# Patient Record
Sex: Male | Born: 1935
Health system: Southern US, Community
[De-identification: ages and names within clinical notes are randomized; demographics above are authoritative.]

## PROBLEM LIST (undated history)

## (undated) DIAGNOSIS — R42 Dizziness and giddiness: Secondary | ICD-10-CM

## (undated) DIAGNOSIS — K219 Gastro-esophageal reflux disease without esophagitis: Secondary | ICD-10-CM

## (undated) DIAGNOSIS — I493 Ventricular premature depolarization: Secondary | ICD-10-CM

## (undated) DIAGNOSIS — I2510 Atherosclerotic heart disease of native coronary artery without angina pectoris: Secondary | ICD-10-CM

## (undated) DIAGNOSIS — I10 Essential (primary) hypertension: Secondary | ICD-10-CM

## (undated) DIAGNOSIS — E7800 Pure hypercholesterolemia, unspecified: Secondary | ICD-10-CM

## (undated) HISTORY — DX: Ventricular premature depolarization: I49.3

## (undated) HISTORY — PX: CATARACT EXTRACTION: SUR2

---

## 1999-07-16 ENCOUNTER — Ambulatory Visit (HOSPITAL_COMMUNITY): Admission: RE | Admit: 1999-07-16 | Discharge: 1999-07-16 | Payer: Self-pay | Admitting: Cardiology

## 1999-07-16 ENCOUNTER — Encounter: Payer: Self-pay | Admitting: Cardiology

## 1999-07-27 ENCOUNTER — Ambulatory Visit (HOSPITAL_COMMUNITY): Admission: RE | Admit: 1999-07-27 | Discharge: 1999-07-28 | Payer: Self-pay | Admitting: Cardiology

## 2000-08-11 ENCOUNTER — Ambulatory Visit (HOSPITAL_COMMUNITY): Admission: RE | Admit: 2000-08-11 | Discharge: 2000-08-11 | Payer: Self-pay | Admitting: Cardiology

## 2000-08-11 ENCOUNTER — Encounter: Payer: Self-pay | Admitting: Cardiology

## 2000-08-17 ENCOUNTER — Ambulatory Visit (HOSPITAL_COMMUNITY): Admission: RE | Admit: 2000-08-17 | Discharge: 2000-08-17 | Payer: Self-pay | Admitting: Cardiology

## 2001-03-23 ENCOUNTER — Encounter: Payer: Self-pay | Admitting: Cardiology

## 2001-03-23 ENCOUNTER — Ambulatory Visit (HOSPITAL_COMMUNITY): Admission: RE | Admit: 2001-03-23 | Discharge: 2001-03-23 | Payer: Self-pay | Admitting: Cardiology

## 2003-04-06 ENCOUNTER — Emergency Department (HOSPITAL_COMMUNITY): Admission: EM | Admit: 2003-04-06 | Discharge: 2003-04-06 | Payer: Self-pay | Admitting: Emergency Medicine

## 2003-12-27 ENCOUNTER — Emergency Department (HOSPITAL_COMMUNITY): Admission: EM | Admit: 2003-12-27 | Discharge: 2003-12-27 | Payer: Self-pay

## 2004-08-09 ENCOUNTER — Emergency Department (HOSPITAL_COMMUNITY): Admission: EM | Admit: 2004-08-09 | Discharge: 2004-08-09 | Payer: Self-pay | Admitting: Emergency Medicine

## 2006-08-29 ENCOUNTER — Emergency Department (HOSPITAL_COMMUNITY): Admission: EM | Admit: 2006-08-29 | Discharge: 2006-08-29 | Payer: Self-pay | Admitting: Emergency Medicine

## 2007-12-26 ENCOUNTER — Emergency Department (HOSPITAL_COMMUNITY): Admission: EM | Admit: 2007-12-26 | Discharge: 2007-12-26 | Payer: Self-pay | Admitting: Family Medicine

## 2007-12-29 ENCOUNTER — Emergency Department (HOSPITAL_COMMUNITY): Admission: EM | Admit: 2007-12-29 | Discharge: 2007-12-29 | Payer: Self-pay | Admitting: Emergency Medicine

## 2008-01-07 ENCOUNTER — Encounter: Admission: RE | Admit: 2008-01-07 | Discharge: 2008-01-07 | Payer: Self-pay | Admitting: Cardiology

## 2008-01-24 ENCOUNTER — Encounter: Admission: RE | Admit: 2008-01-24 | Discharge: 2008-01-24 | Payer: Self-pay | Admitting: Cardiology

## 2008-01-24 IMAGING — RF DG ESOPHAGUS
14 of 18 series · 19 of 24 positions shown · non-contrast
Comparison: [REDACTED] chest x-ray [DATE]

CLINICAL DATA: Painful swallowing /dysphasia

BARIUM SWALLOW / ESOPHAGRAM
TECHNIQUE: Double and single contrast barium swallow with ingested
13 mm barium tablet with water

[Series 1: run · 1 of 2 slices shown (1 of 14)]
[im 1/2]
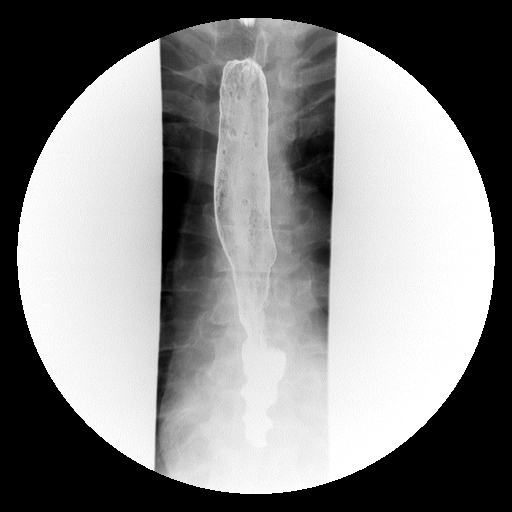

[Series 2: run · 1 of 3 slices shown (2 of 14)]
[im 1/3]
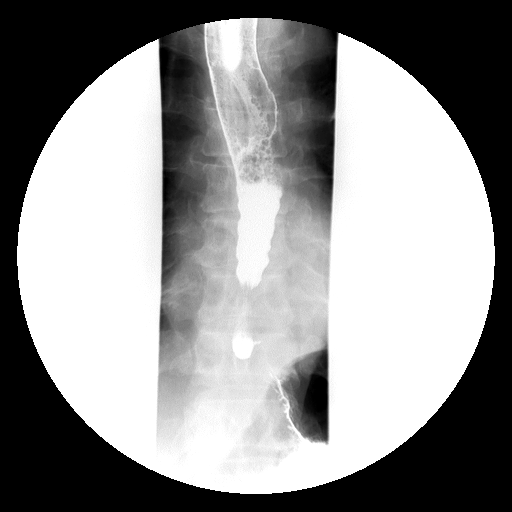

[Series 4: run · 1 of 19 slices shown (3 of 14)]
[im 1/19]
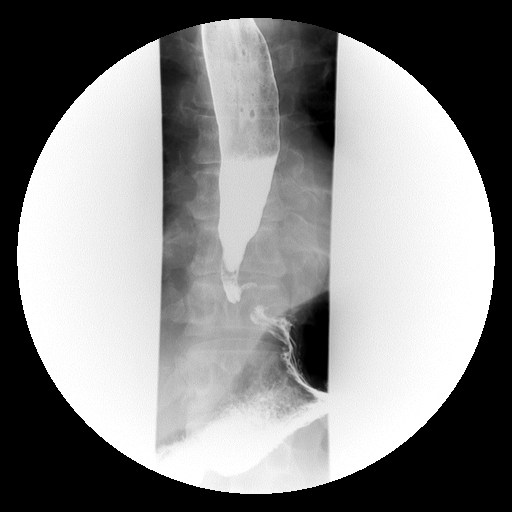

[Series 5: run · 1 of 1 slices shown (4 of 14)]
[im 1/1]
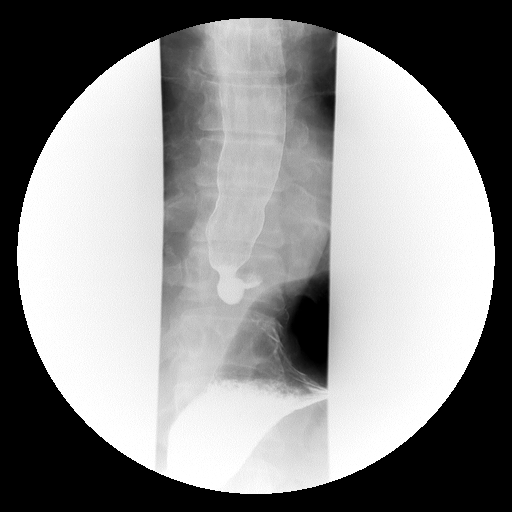

[Series 6: run · 1 of 1 slices shown (5 of 14)]
[im 1/1]
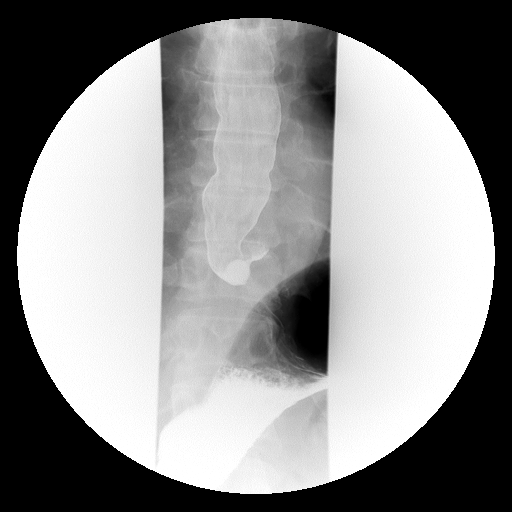

[Series 7: run · 1 of 1 slices shown (6 of 14)]
[im 1/1]
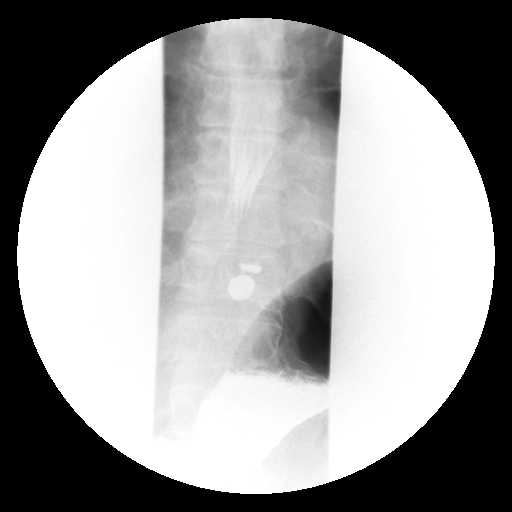

[Series 9: run · 1 of 16 slices shown (7 of 14)]
[im 1/16]
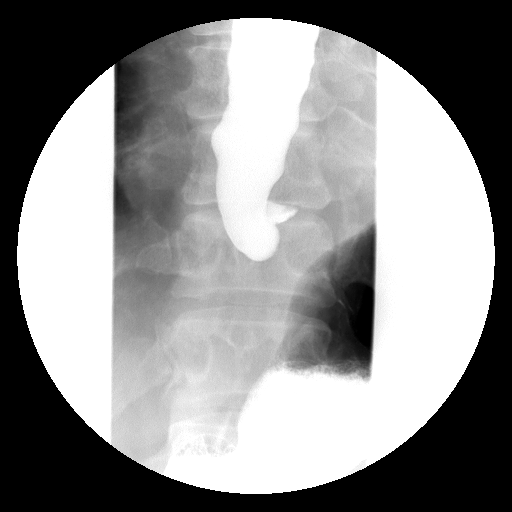

[Series 10: run · 1 of 9 slices shown (8 of 14)]
[im 1/9]
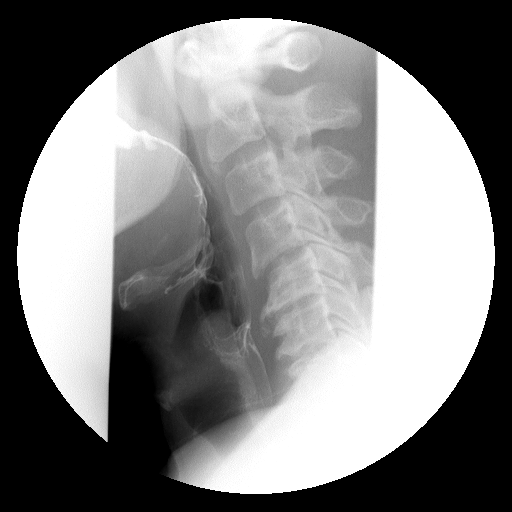

[Series 11: run · 1 of 8 slices shown (9 of 14)]
[im 1/8]
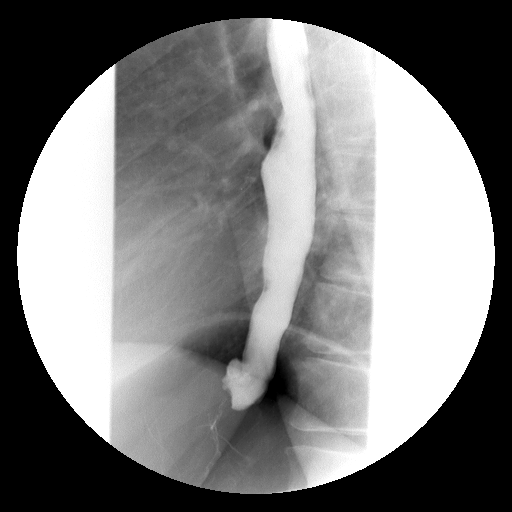

[Series 13: run · 1 of 2 slices shown (10 of 14)]
[im 1/2]
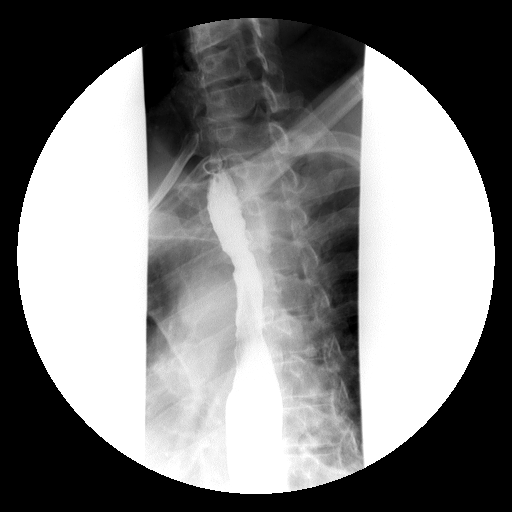

[Series 14: run · 1 of 3 slices shown (11 of 14)]
[im 1/3]
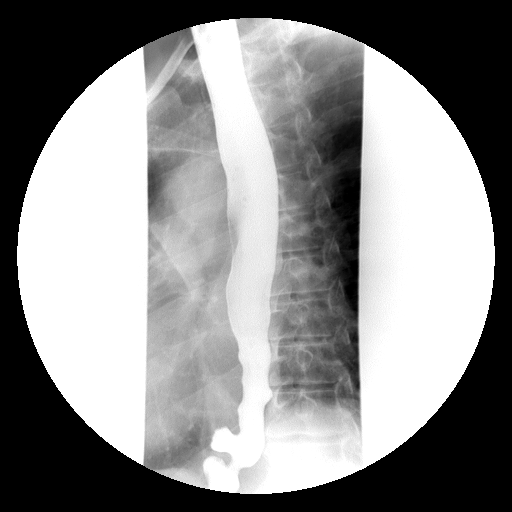

[Series 15: run · 6 of 47 slices shown (12 of 14)]
[im 1/47]
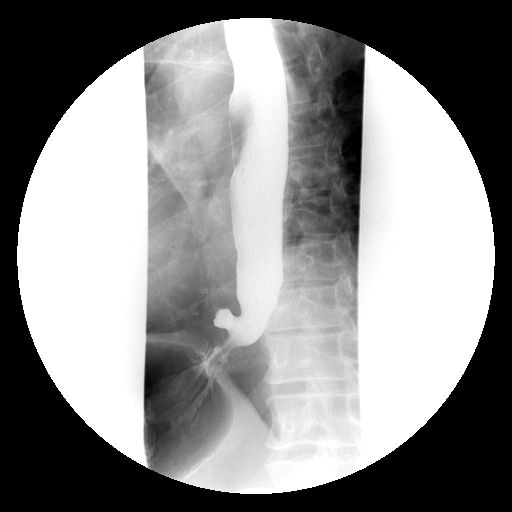
[im 8/47]
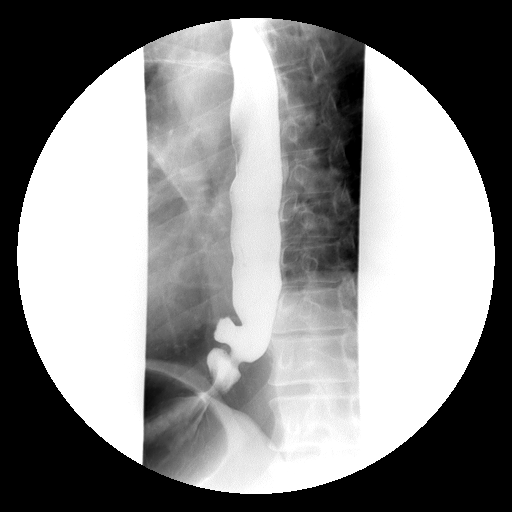
[im 24/47]
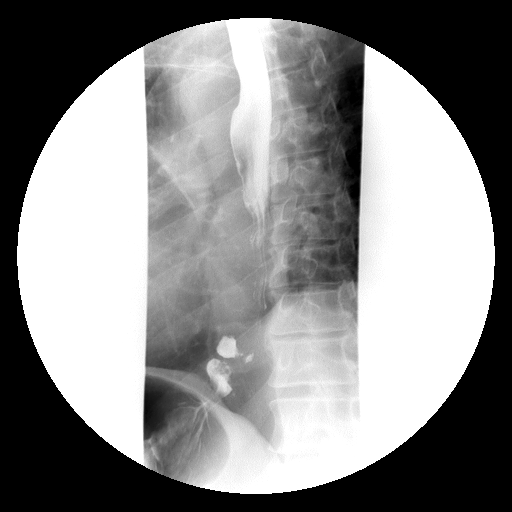
[im 31/47]
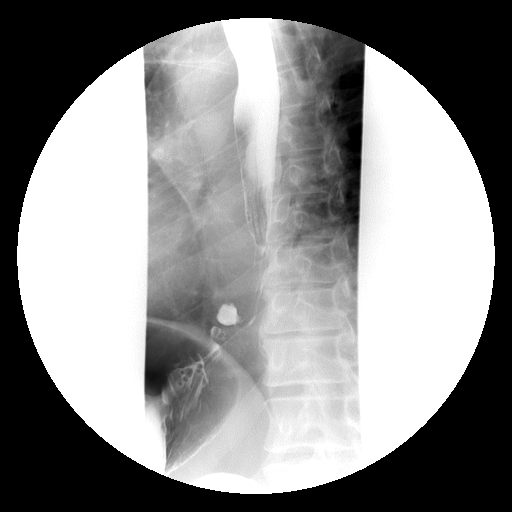
[im 39/47]
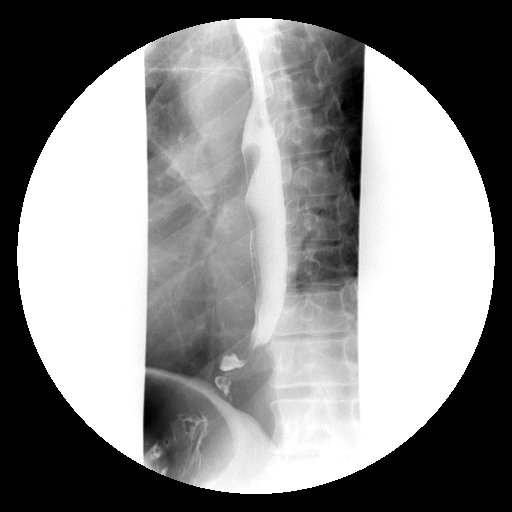
[im 47/47]
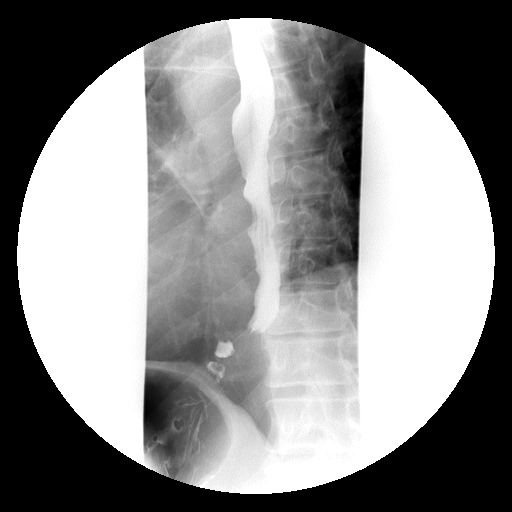

[Series 17: run · 1 of 1 slices shown (13 of 14)]
[im 1/1]
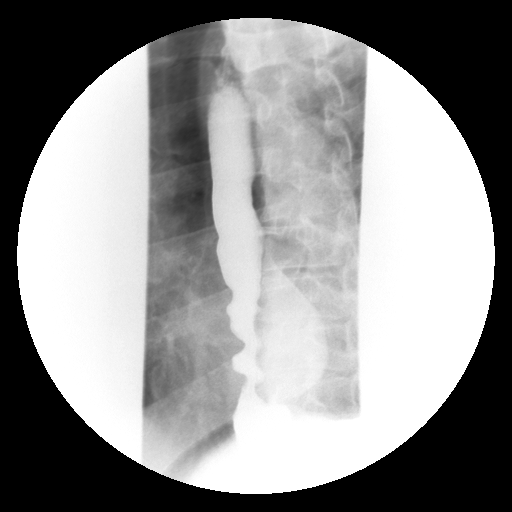

[Series 18: run · 1 of 1 slices shown (14 of 14)]
[im 1/1]
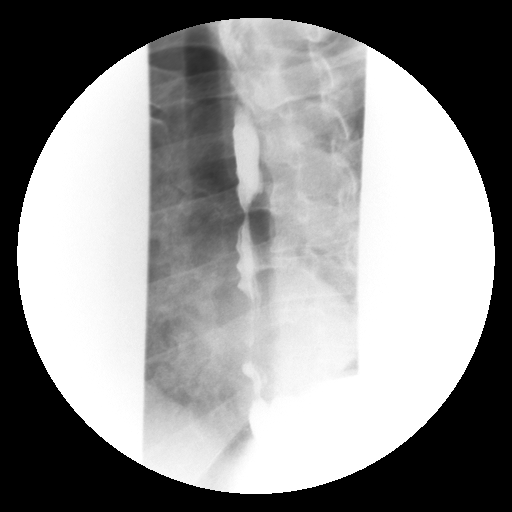

[19 of 24 positions shown; findings below may reference images not displayed]

FINDINGS: Primary cervical esophageal peristalsis is maintained
with slight tertiary contractions in the distal esophagus.  Very
small sliding type hiatus hernia is seen with approximate 1 cm
pulsion type diverticulum at the distal esophagus.  Partially
obstructing lower esophageal/Schatzki's ring noted to ingested 13
mm barium tablet.  No significant obstruction to liquid barium is
seen as a result.  Moderate induced gastroesophageal reflux was
demonstrated (Valsalva/water siphon testing).  No other intrinsic
or extrinsic esophageal lesion is seen.
IMPRESSION: 1.  Very small sliding type hiatus hernia with partially
obstructing lower esophageal/Schatzki's ring (13 mm barium tablet).
2.  Pulsion diverticulum distal esophagus.
3.  Slight tertiary contractions distal thoracic esophagus.
4.  Moderate induced gastroesophageal reflux.

## 2008-01-30 ENCOUNTER — Ambulatory Visit (HOSPITAL_COMMUNITY): Admission: RE | Admit: 2008-01-30 | Discharge: 2008-01-30 | Payer: Self-pay | Admitting: Gastroenterology

## 2008-02-01 ENCOUNTER — Encounter: Admission: RE | Admit: 2008-02-01 | Discharge: 2008-02-01 | Payer: Self-pay | Admitting: Gastroenterology

## 2008-02-07 ENCOUNTER — Ambulatory Visit (HOSPITAL_COMMUNITY): Admission: RE | Admit: 2008-02-07 | Discharge: 2008-02-07 | Payer: Self-pay | Admitting: Gastroenterology

## 2008-04-09 ENCOUNTER — Ambulatory Visit (HOSPITAL_COMMUNITY): Admission: RE | Admit: 2008-04-09 | Discharge: 2008-04-09 | Payer: Self-pay | Admitting: Cardiology

## 2009-01-22 ENCOUNTER — Inpatient Hospital Stay (HOSPITAL_BASED_OUTPATIENT_CLINIC_OR_DEPARTMENT_OTHER): Admission: RE | Admit: 2009-01-22 | Discharge: 2009-01-22 | Payer: Self-pay | Admitting: Cardiology

## 2009-01-22 ENCOUNTER — Inpatient Hospital Stay (HOSPITAL_COMMUNITY): Admission: AD | Admit: 2009-01-22 | Discharge: 2009-01-23 | Payer: Self-pay | Admitting: Cardiology

## 2009-01-29 ENCOUNTER — Encounter (HOSPITAL_COMMUNITY): Admission: RE | Admit: 2009-01-29 | Discharge: 2009-04-29 | Payer: Self-pay | Admitting: Cardiology

## 2009-09-28 ENCOUNTER — Encounter (HOSPITAL_COMMUNITY): Admission: RE | Admit: 2009-09-28 | Discharge: 2009-12-07 | Payer: Self-pay | Admitting: Cardiology

## 2010-10-24 ENCOUNTER — Encounter: Payer: Self-pay | Admitting: Cardiology

## 2011-01-12 LAB — BASIC METABOLIC PANEL
BUN: 6 mg/dL (ref 6–23)
Chloride: 105 mEq/L (ref 96–112)
GFR calc Af Amer: >60 mL/min (ref >60)
GFR calc non Af Amer: >60 mL/min (ref >60)
Potassium: 3.8 mEq/L (ref 3.5–5.1)
Sodium: 137 mEq/L (ref 135–145)

## 2011-01-12 LAB — CK TOTAL AND CKMB (NOT AT ARMC)
CK, MB: 3 ng/mL (ref 0.3–4.0)
Relative Index: 2.5 (ref 0.0–2.5)

## 2011-01-12 LAB — CBC
HCT: 41.6 % (ref 39.0–52.0)
Hemoglobin: 14 g/dL (ref 13.0–17.0)
MCV: 83.3 fL (ref 78.0–100.0)
Platelets: 250 10*3/uL (ref 150–400)
RBC: 5 MIL/uL (ref 4.22–5.81)
WBC: 7.5 10*3/uL (ref 4.0–10.5)

## 2011-02-15 NOTE — Op Note (Signed)
NAME:  Logan Orozco NO.:  [id number]   MEDICAL RECORD NO.:  [id number]          PATIENT TYPE:  AMB   LOCATION:  ENDO                         FACILITY:  [facility]   PHYSICIAN:  James L. Malon Kindle., M.D.DATE OF BIRTH:  [birth year]   DATE OF PROCEDURE:  02/07/2008  DATE OF DISCHARGE:  02/07/2008                               OPERATIVE REPORT   PROCEDURE:  Esophageal manometry.   INDICATION:  Patient has had an endoscopy with a dilatation with  narrowing at the GE junction continuing to choke, difficulty swallowing.  This procedure is done to evaluate for possible achalasia.  No  provocative studies were done.  It was performed in the usual manner  with no provocative studies.  Results were as follows.   Upper esophageal sphincter appeared to relax well with swallowing with  residual pressure 4.9, normal being less than 8.  Esophageal body  peristalsis was normal but there was increased amplitude in the distal  esophagus in some swallows.  The mean amplitude in the distal esophagus  was 218 mm with 7.8 seconds.  There was normal peristalsis in all  swallows.  Lower esophageal sphincter mean pressure 44 right at the  upper limits of normal, but residual pressure of only 11.5 with normal  being less than 8.  This was slightly elevated.  The lower esophageal  sphincter did appear to relax with swallowing.   ASSESSMENT:  Nonspecific esophageal motility disorder manifested by  increased peristalsis in the distal esophagus with slight increased  residual pressure in the LES.  This does not meet criteria for achalasia  but may be a situation in which this could evolve to achalasia.   PLAN:  Will see the patient back in the office.  Will continue him on  therapy for reflux at this time.  Will likely try to repeat another  endoscopy and dilatation.           ______________________________  Llana Aliment Malon Kindle., M.D.     Waldron Session  D:  02/12/2008  T:  02/12/2008  Job:   [id number]   cc:   Osvaldo Shipper. Spruill, M.D.  Fax: [phone]

## 2011-02-15 NOTE — Cardiovascular Report (Signed)
NAME:  Logan Orozco, Logan Orozco NO.:  [id number]   MEDICAL RECORD NO.:  [id number]          PATIENT TYPE:  INP   LOCATION:  2506                         FACILITY:  [facility]   PHYSICIAN:  Mohan N. Sharyn Lull, M.D. DATE OF BIRTH:  [birth year]   DATE OF PROCEDURE:  01/22/2009  DATE OF DISCHARGE:                            CARDIAC CATHETERIZATION   PROCEDURE:  Left cardiac catheterization with selective left and right  coronary angiography, left ventriculography via right groin using  Judkins technique.   INDICATIONS FOR THE PROCEDURE:  Logan Orozco is 76 year old black male with  past medical history significant for coronary artery disease, status  post PTCA and stenting to RCA in the past; hypertension;  hypercholesteremia; complains of retrosternal chest pressure, grade  4/10, radiating to the left arm associated with minimal exertion.  He  states chest pain increases with walking and relieves with rest.  Denies  any nausea, vomiting, diaphoresis, but complains of mild shortness of  breath.  Denies palpitation, lightheadedness, or syncope.  Denies PND,  orthopnea, or leg swelling.  Denies rest or nocturnal angina.  Due to  typical anginal chest pain, multiple risk factors, and prior PCI, I  discussed with the patient regarding left cath, its risks, and benefits  i.e., death, MI, stroke, need for emergency CABG, risk of restenosis,  local vascular complications, etc., and consented for the procedure.   PROCEDURE:  After obtaining the informed consent.  The patient was  brought to the cath lab and was placed on fluoroscopy table.  Right  groin was prepped and draped in usual fashion.  Xylocaine 2% was used  for local anesthesia in the right groin.  With the help of thin-wall  needle, 4-French arterial sheath was placed.  The sheath was aspirated  and flushed.  Next, a 4-French left Judkins catheter was advanced over  the wire under fluoroscopic guidance up to the ascending aorta.   Wire  was pulled out.  The catheter was aspirated and connected to the  manifold.  Catheter was further advanced and engaged into left coronary  ostium.  Multiple views of the left system were taken.  Next, the  catheter was disengaged and was pulled out over the wire and was  replaced with 4-French 3D diagnostic catheter, which was advanced over  the wire under fluoroscopic guidance up to the ascending aorta.  Wire  was pulled out.  The catheter was aspirated and connected to the  manifold.  Catheter was further advanced and engaged into right coronary  ostium.  Multiple views of the right system were taken.  Next, the  catheter was disengaged and was pulled out over the wire and was  replaced with 4-French pigtail catheter, which was advanced over the  wire under fluoroscopic guidance up to the ascending aorta.  The wire  was pulled out.  Catheter was aspirated and connected to the manifold.  Catheter was further advanced across the aortic valve into the LV.  LV  pressures were recorded.  Next, LV graphy was done in 30-degree RAO  position.  Post angiographic pressures were recorded from LV and  pullback  pressures were recorded from the aorta.  There was no gradient  across the aortic valve.  Next, the pigtail catheter was pulled out over  the wire.  Logan were aspirated and flushed.   Orozco:  LV showed mild inferobasal and mid hypokinesia, EF of 50%-  55%.  Left main was short, which was patent.  LAD has 50%-60% proximal  stenosis.  Diagonal 1 was very small, which was patent.  Diagonal 2 was  very, very small.  Diagonal 3 has ostial 75%-80% stenosis, which is a  small vessel as before.  Diagonal 4 and 5 were very very small.  Left  circumflex has 10%-20% mid stenosis with a small vessel.  High OM-1 was  moderate size, which was patent.  OM-2 was small, which was patent.  RCA  has 30%-40% mid sequential stenosis and mid and distal junction stents  were patent.  Beyond the stents,  there was 99% stenosis in distal RCA  with TIMI grade III distal flow.  PDA was small, which was patent.  PLV  branch has 30%-40% mid stenosis.  The patient tolerated the procedure  well.  The patient states this morning he woke up with chest pain while  taking shower and walking at  home due to critical lesions.  The patient will be transferred for  emergency PCI to distal RCA.  The patient did receive in the cath lab  post procedure 4 baby aspirins, 60 mg of __________, and 2000 units of  heparin.  The patient will be transferred for PCI immediately once the  room is available.       Eduardo Osier. Sharyn Lull, M.D.  Electronically Signed     MNH/MEDQ  D:  01/22/2009  T:  01/23/2009  Job:  [id number]   cc:   Cath Lab  Osvaldo Shipper. Spruill, M.D.

## 2011-02-15 NOTE — Discharge Summary (Signed)
NAME:  Logan Orozco, TRICE NO.:  [id number]   MEDICAL RECORD NO.:  [id number]          PATIENT TYPE:  INP   LOCATION:  2506                         FACILITY:  [facility]   PHYSICIAN:  Mohan N. Sharyn Lull, M.D. DATE OF BIRTH:  08/03/1935   DATE OF ADMISSION:  01/22/2009  DATE OF DISCHARGE:  01/23/2009                               DISCHARGE SUMMARY   ADMITTING DIAGNOSES:  1. New-onset angina, rule out progression of coronary artery disease.  2. Hypertension.  3. Hypercholesteremia.  4. Coronary artery disease.  5. History of percutaneous coronary intervention in the past.   FINAL DIAGNOSIS:  1. New-onset angina status post percutaneous transluminal coronary      angioplasty stenting to distal right coronary artery.  2. Hypertension.  3. Hypercholesteremia.  4. Gastroesophageal reflux disease.   DISCHARGE HOME MEDICATIONS:  1. Enteric-coated aspirin 325 mg 1 tablet daily.  2. Effient 10 mg 1 tablet daily with food.  3. Toprol-XL 25 mg 1 tablet daily.  4. Diovan 320 mg 1 tablet daily.  5. Caduet 10/20 one tablet daily.  6. Nitrostat 0.4 mg sublingual use as directed.  7. Protonix 40 mg 1 tablet daily.  8. Eyedrops as before.   INSTRUCTIONS:  Diet:  Low salt, low cholesterol.  Post PTCA stent  instructions have been given.  Increase activity slowly as tolerated.  No lifting, driving, pushing, or pulling for 48 hours.  Follow up with  me in 1 week.   Condition at discharge is stable.  The patient is scheduled for phase II  cardiac rehab as outpatient.   BRIEF HISTORY AND HOSPITAL COURSE:  [name] is a 76 year old black  male with past medical history significant for coronary artery disease  status post PTCA stenting to RCA in the past, hypertension,  hypercholesteremia, complains of retrosternal chest pressure grade 4/10  radiating to the left arm associated with minimal exertion.  He states  the chest pain increases with walking and relieves with rest.  Denies  any nausea, vomiting, diaphoresis but complains of mild shortness of  breath.  Denies any palpitation, lightheadedness, or syncope.  Denies  PND, orthopnea, or leg swelling.  Denies rest or nocturnal angina.   PAST MEDICAL HISTORY:  As above.   PAST SURGICAL HISTORY:  He had left neck lymph node resection in the  past, had PTCA stenting to mid and distal RCA in 2000.   SOCIAL HISTORY:  He is married, two children.  Drinks beer socially,  occasionally.  No history of smoking or drug abuse.   FAMILY HISTORY:  Positive for coronary artery disease.   ALLERGIES:  No known drug allergies.   MEDICATION AT HOME:  He was on:  1. Enteric-coated aspirin 81 mg p.o. daily.  2. Plavix 75 mg p.o. daily.  3. Caduet 10/20 p.o. daily.  4. Diovan 320 p.o. daily.  5. Protonix 40 p.o. daily.  6. Nitrostat sublingual p.r.n.  7. Nitro-Dur patch 0.4 mg per hour daily.  8. Toprol-XL 25 mg p.o. daily.   PHYSICAL EXAMINATION:  GENERAL:  He is alert, awake, and oriented x3 in  no acute distress.  VITAL SIGNS:  Blood pressure was 130/80, pulse was 83 and regular.  HEENT:  Conjunctivae pink.  NECK:  Supple.  No JVD.  No bruit.  LUNGS:  Clear to auscultation without rhonchi or rales.  CARDIOVASCULAR:  S1 and S2 was normal.  There was soft systolic murmur.  There was no S3 gallop.  ABDOMEN:  Soft.  Bowel sounds were present, nontender.  EXTREMITIES:  There is no clubbing, cyanosis, or edema.   BRIEF HOSPITAL COURSE:  The patient was admitted and had elective  outpatient cardiac cath as per procedure report.  The patient was noted  to have critical distal RCA lesion.  The patient had chest pain prior to  arriving to the hospital and was felt to be unstable lesions, so  subsequently underwent PCI to distal RCA as per procedure report.  The  patient tolerated the procedure well.  There were no complications.  Postprocedure, the patient did not have any episodes of chest pain  during the hospital stay.   Phase I cardiac rehab was called.  The  patient has been ambulating in hallway without any problems.  His groin  is stable.  Postprocedure, his CPKs are within normal range.  The  patient will be discharged home on above medications and will be  followed up in my office in 1 week and Dr. Shana Chute as scheduled.  The  patient is also referred for phase II cardiac rehab as outpatient.      Logan Orozco. Sharyn Lull, M.D.  Electronically Signed     MNH/MEDQ  D:  01/23/2009  T:  01/23/2009  Job:  [id number]   cc:   Osvaldo Shipper. Spruill, M.D.

## 2011-02-15 NOTE — Cardiovascular Report (Signed)
NAME:  JANET, DECESARE NO.:  [id number]   MEDICAL RECORD NO.:  [id number]          PATIENT TYPE:  INP   LOCATION:  2506                         FACILITY:  [facility]   PHYSICIAN:  Mohan N. Sharyn Lull, M.D. DATE OF BIRTH:  1935-07-18   DATE OF PROCEDURE:  01/22/2009  DATE OF DISCHARGE:                            CARDIAC CATHETERIZATION   PROCEDURE:  1. Successful percutaneous transluminal coronary angioplasty to distal      right coronary artery using 2.5 x 12 mm long Voyager balloon.  2. Successful deployment of 3.5 x 23 mm long Zion drug-eluting stent      in distal right coronary artery.  3. Successful post dilatation of Zion stent using 3.75 x 15 mm long [place]      Voyager balloon.   INDICATIONS FOR PROCEDURE:  Mr. Henes is a 76 year old black male with  past medical history significant for coronary artery disease status post  PTCA stenting to RCA in the past, hypertension, hypercholesterolemia.  He complains of retrosternal chest pressure, grade 4/10, radiating to  left arm associated with minimal exertion.  He states chest pain  increases with walking and relieves with rest.  Denies any nausea,  vomiting, diaphoresis, but complains of mild shortness of breath.  Denies palpitations, lightheadedness, or syncope.  Denies PND,  orthopnea, or leg swelling.  Denies rest or nocturnal angina.  Due to  recurrent chest pain and multiple risk factors, the patient underwent  cardiac cath today as an outpatient.  As per procedure report, the  patient did state, he had chest pressure earlier this morning while in  the bathroom and while being at room.  The patient was noted to have  critical stenosis in distal RCA beyond the RCA stent.  Due to critical  stenosis and recurrent chest pain, discussed with the patient and his  wife regarding PTCA and stenting to distal RCA, its risks and benefits,  i.e., death, MI, stroke, need for emergency CABG, risk of restenosis,  and local  vascular complications, etc., and consented for the procedure.   PROCEDURE:  After obtaining the informed consent, the patient was  transferred to the Cath Lab.  The patient was placed on fluoroscopy  table.  Right groin was prepped and draped in usual fashion.  __________  sheath was exchanged over the wire under sterile condition to a 6-French  arterial sheath without difficulty.  A 6-French JR guiding catheter with  guide rolls was advanced over the wire under fluoroscopic guidance up to  the ascending aorta.  Wire was pulled out.  The catheter wire aspirated  and connected to the manifold.  Catheter was further advanced and  engaged into right coronary ostium.  A single-view of right coronary  artery was obtained.  Findings were as above.  The patient had critical  99% stenosis in the distal RCA.  Interventional procedure, successful  PTCA.  Findings, the patient had 20-30% ostial and 30-40% sequential mid  RCA stenosis, mid and distal junction stent is patent.  Beyond the  stent, there was 99% stenosis.  PDA was small which was patent.  PLV  branch  has 30-40% mid stenosis.  Interventional procedure, successful  PTCA to distal RCA was done using 2.5 x 12 mm long Voyager balloon for  pre-dilatation and then 3.5 x 23 mm long  Zion stent was deployed in  distal RCA, poor lapping with proximal edge of the stent at 13, advanced  with pressure.  Stent was fully expanded using 3.75 x 15 mm long [place]  Voyager balloon going up to 18 atmospheres pressure.  Lesion was dilated  from 99% to 0% residual with excellent TIMI grade III  distal flow without evidence of dissection or distal embolization.  The  patient received weight-based Angiomax, 600 mg of prasugrel prior to the  procedure.  The patient tolerated the procedure well.  There were no  complications.  The patient was transferred to the recovery room in  stable condition.      Eduardo Osier. Sharyn Lull, M.D.  Electronically Signed      MNH/MEDQ  D:  01/22/2009  T:  01/23/2009  Job:  [id number]   cc:   Osvaldo Shipper. Spruill, M.D.  Catheterization Lab

## 2011-02-15 NOTE — Op Note (Signed)
NAME:  Logan Orozco, Logan Orozco NO.:  [id number]   MEDICAL RECORD NO.:  [id number]          PATIENT TYPE:  AMB   LOCATION:  ENDO                         FACILITY:  [facility]   PHYSICIAN:  James L. Malon Kindle., M.D.DATE OF BIRTH:  05-11-1935   DATE OF PROCEDURE:  01/30/2008  DATE OF DISCHARGE:                               OPERATIVE REPORT   PROCEDURE:  Esophagogastroduodenoscopy with Savary dilatation.   MEDICATIONS:  Fentanyl 50 mcg, Versed 5 mg IV, Cetacaine spray.   INDICATIONS:  Patient has had some dysphagia, had a barium swallow  showing narrowing at the GE junction that obstructed a 13-mm barium  tablet.  The procedure including potential risks and benefits had been  explained to the patient in the office and explained to his wife and  consent obtained.  In the left lateral decubitus position in the  endoscopy unit using C-arm for fluoroscopic guidance, the Pentax upper  endoscope was inserted blindly and with agglutination.  The esophagus  was entered.  The distal esophagus was reached.  There was a narrowing  but not a clear ring or stricture at the distal esophagus.  The scope  easily passed.  Complete endoscopy was performed.  There was marked  gastritis and biopsy for CLO-test was obtained.  There was no  ulceration.  The scope withdrawn back into the esophagus and the distal  esophagus was endoscopically evaluated and photographed.  I then went  back in the stomach and passed a Savary guide wire through the scope and  withdrew the scope over the guidewire using fluoroscopic guidance.  Then  with the patient's neck extended, we passed a 12.8-mm dilator which went  smoothly with minimal resistance.  The 14-mm dilator was passed with  some resistance and small amount of heme on the dilator.  The dilator  and wire were then removed as a unit.  The patient tolerated the  procedure well.  There were no immediate complications.  He went to the  recovery room in good  condition.   ASSESSMENT:  1. Esophageal stricture of a minimal nature dilated to 14 mm.  2. Gastritis.  CLO-test obtained.   PLAN:  Will continue current medications, including Nexium.  Will have  the patient call for any problems.  Will see back in the office in two  to three weeks.           ______________________________  Llana Aliment Malon Kindle., M.D.     Waldron Session  D:  01/30/2008  T:  01/30/2008  Job:  [id number]   cc:   Osvaldo Shipper. Spruill, M.D.  Fax: [phone]

## 2011-02-22 NOTE — Cardiovascular Report (Signed)
[facility]  Patient:    Logan Orozco                 MRN: [id number] Proc. Date: 08/17/00 Adm. Date:  [id number] Attending:  Robynn Pane CC:         Cardiac Catheterization Lab  Logan Orozco, M.D.   Cardiac Catheterization  PROCEDURES: 1. Left heart catheterization. 2. Selective left and right coronary angiography. 3. Left ventriculography via right groin using Judkins technique.  INDICATIONS FOR THE PROCEDURE:  [name] is a 76 year old black male with past medical history significant for coronary artery disease, status post PTCA and stenting to the mid RCA in October 2000.  He has history of hypertension, hypercholesterolemia, history of bronchitis.  He complains of retrosternal chest tightness, radiating to the left arm off and on.  This is lasting for a few minutes and without associated other symptoms.  He denies any nausea, vomiting, diaphoresis.  Denies rest or nocturnal angina.  Denies orthopnea, leg swelling.  Denies palpitations, lightheadedness or syncope.  The patient underwent stress Cardiolite on August 11, 2000.  This showed distal lateral wall ischemia with an ejection fraction of 63%.  Due to recurrent chest tightness and positive stress Cardiolite, as well as multiple risk factors and significant coronary artery disease in the past, the patient was advised for cardiac catheterization as well as angioplasty.  DESCRIPTION OF PROCEDURE:  After obtaining the informed consent, the patient was brought to the catheterization lab.  He was placed on the fluoroscopy table, where the right groin was prepped and draped in the usual fashion. Xylocaine 2% was used for local anesthesia in the right groin.  With the help of a thin-walled needle, a 6-French arterial sheath was placed. The sheath was aspirated and flushed.  Next, 6-French left Judkins catheter was advanced over the wire under fluoroscopic  guidance up to the ascending aorta.  The wire was pulled out, the catheter was aspirated and connected to the manifold.  The catheter was further advanced and engaged into the left coronary ostium.  Multiple views of the left system were taken.  Next, the catheter was disengaged and was pulled out over the wire; replaced with 6-French right Judkins catheter.  This was advanced over the wire under fluoroscopic guidance up to the ascending aorta.  The wire was pulled out, the catheter was aspirated and connected to the manifold.  The catheter was further advanced and engaged into the right coronary ostium. Multiple views of the right system were taken.  Next, the catheter was disengaged and was pulled out over the wire; replaced with 6-French pigtail catheter.  This was advanced over the wire under fluoroscopic guidance up to the ascending aorta.  The catheter was further advanced across the aortic valve into the LV.  Left ventricular pressures were recorded.  Next, left ventriculography was performed in 30-degree RAO position. Post-angiographic pressures were recorded from the LV, and then pullback pressures were recorded from the aorta.  There was no gradient across the aortic valve.  Next, the pigtail catheter was pulled out over the wire.  The sheaths were aspirated and flushed.  FINDINGS:  The patient has good LV systolic function.  CORONARY ANGIOGRAPHY: 1. LEFT MAIN:  Short, but patent. 2. LEFT ANTERIOR DESCENDING ARTERY:  Has mild proximal plaquing.  The first    diagonal is small and has 15-20% ostial stenosis, 10-15% proximal stenosis.    The second diagonal has 60-70% ostial stenosis, which  appears smooth and    the vessel is approximately 1.5 mm in size.  The third diagonal is very,    very small but patent. 3. LEFT CIRCUMFLEX:  Small and patent.  It tapers down into the AV groove.    OM1 is large.  OM2 is small but vessels are patent. 4. RIGHT CORONARY ARTERY:  Has  20-30% proximal stenosis.  Stented mid portion    of the RCA is patent.  DISPOSITION:  The patient tolerated the procedure well, there were no complications.  PLAN:  To maximize medical management.  If he continues to have recurrent or prolonged chest pain requiring frequent nitroglycerin, then will need to consider PTCA to ostial diagonal.  This was discussed with the patient and he agrees.  The patient will be discharged home this afternoon if hemodynamically stable. DD:  08/17/00 TD:  08/17/00 Job: [id number]

## 2011-06-28 LAB — CLOTEST (H. PYLORI), BIOPSY: Helicobacter screen: NEGATIVE

## 2011-08-20 ENCOUNTER — Other Ambulatory Visit: Payer: Self-pay | Admitting: Cardiology

## 2012-01-01 ENCOUNTER — Encounter (HOSPITAL_COMMUNITY): Payer: Self-pay | Admitting: *Deleted

## 2012-01-01 ENCOUNTER — Emergency Department (HOSPITAL_COMMUNITY)
Admission: EM | Admit: 2012-01-01 | Discharge: 2012-01-01 | Disposition: A | Discharge status: Home / Self Care | Payer: Self-pay | Source: Home / Self Care | Attending: Emergency Medicine | Admitting: Emergency Medicine

## 2012-01-01 DIAGNOSIS — R197 Diarrhea, unspecified: Secondary | ICD-10-CM

## 2012-01-01 HISTORY — DX: Pure hypercholesterolemia, unspecified: E78.00

## 2012-01-01 HISTORY — DX: Gastro-esophageal reflux disease without esophagitis: K21.9

## 2012-01-01 HISTORY — DX: Essential (primary) hypertension: I10

## 2012-01-01 MED ORDER — ONDANSETRON 4 MG PO TBDP
ORAL_TABLET | ORAL | Status: AC
  Filled 2012-01-01: qty 1

## 2012-01-01 MED ORDER — ONDANSETRON 4 MG PO TBDP
4.0000 mg | ORAL_TABLET | Freq: Once | ORAL | Status: AC
  Administered 2012-01-01: 4 mg via ORAL

## 2012-01-01 MED ORDER — ONDANSETRON HCL 4 MG PO TABS: 4.0000 mg | ORAL_TABLET | Freq: Four times a day (QID) | ORAL | Status: AC

## 2012-01-01 MED ORDER — HYDROCODONE-ACETAMINOPHEN 10-325 MG PO TABS
1.0000 | ORAL_TABLET | Freq: Once | ORAL | Status: AC
  Administered 2012-01-01: 1 via ORAL

## 2012-01-01 MED ORDER — DIPHENOXYLATE-ATROPINE 2.5-0.025 MG PO TABS: 1.0000 | ORAL_TABLET | Freq: Four times a day (QID) | ORAL | Status: AC | PRN

## 2012-01-01 MED ORDER — HYDROCODONE-ACETAMINOPHEN 5-325 MG PO TABS
ORAL_TABLET | ORAL | Status: AC
  Filled 2012-01-01: qty 1

## 2012-01-01 NOTE — ED Provider Notes (Signed)
History     CSN: [id number]  Arrival date & time 01/01/12  1707   First MD Initiated Contact with Patient 01/01/12 1739      Chief Complaint  Patient presents with  . Diarrhea  . Emesis  . Headache    (Consider location/radiation/quality/duration/timing/severity/associated sxs/prior treatment) HPI Comments: Patient presents urgent care today after he has been expressing about 7 episodes of liquidy diarrhea and vomited at home yesterday once and today during the course of the day once patient denies any abdominal pain. Describes that he thinks he has had fevers he hasn't helped warm at times but have not measures temperature. Patient is a bit nauseous and it's able to drink fluids but doesn't want to [place] he is on have more diarrheas. Also complains of a headache frontal and pressure in character. No further symptoms such as weakness, numbness tingling visual changes or any other symptoms. Patient also denies any respiratory symptoms  Patient is a 77 y.o. male presenting with diarrhea, vomiting, and headaches. The history is provided by the patient and the spouse.  Diarrhea The primary symptoms include fever, nausea, vomiting, diarrhea and myalgias. Primary symptoms do not include abdominal pain, melena, hematemesis, dysuria, arthralgias or rash. The illness began yesterday. The onset was sudden. The problem has not changed since onset. The illness is also significant for chills and anorexia. The illness does not include back pain or itching.  Emesis  Associated symptoms include chills, diarrhea, a fever, headaches and myalgias. Pertinent negatives include no abdominal pain, no arthralgias and no cough.  Headache The primary symptoms include headaches, fever, nausea and vomiting.    Past Medical History  Diagnosis Date  . GERD (gastroesophageal reflux disease)   . Hypercholesteremia   . Hypertension   . Glaucoma   . Cataract     Past Surgical History  Procedure Date  . Cataract  extraction     x2    No family history on file.  History  Substance Use Topics  . Smoking status: Never Smoker   . Smokeless tobacco: Not on file  . Alcohol Use: No      Review of Systems  Constitutional: Positive for fever, chills, activity change and appetite change.  HENT: Negative for congestion and rhinorrhea.   Eyes: Positive for discharge. Negative for visual disturbance.  Respiratory: Negative for cough, shortness of breath, wheezing and stridor.   Gastrointestinal: Positive for nausea, vomiting, diarrhea and anorexia. Negative for abdominal pain, melena and hematemesis.  Genitourinary: Negative for dysuria, frequency and discharge.  Musculoskeletal: Positive for myalgias. Negative for back pain and arthralgias.  Skin: Negative for itching and rash.  Neurological: Positive for headaches.    Allergies  Review of patient's allergies indicates no known allergies.  Home Medications   Current Outpatient Rx  Name Route Sig Dispense Refill  . AMLODIPINE BESYLATE PO Oral Take by mouth daily.    . ASPIRIN 81 MG PO TABS Oral Take 81 mg by mouth daily.    . ATORVASTATIN CALCIUM PO Oral Take by mouth daily.    Marland Kitchen LUMIGAN OP Ophthalmic Apply to eye.    . DORZOLAMIDE HCL 2 % OP SOLN  1 drop 3 (three) times daily.    Marland Kitchen DOXAZOSIN MESYLATE PO Oral Take by mouth at bedtime.    Marland Kitchen METOPROLOL TARTRATE PO Oral Take by mouth daily.    Marland Kitchen OMEPRAZOLE PO Oral Take by mouth 2 (two) times daily.    Marland Kitchen TIMOLOL HEMIHYDRATE 0.5 % OP SOLN  1 drop  2 (two) times daily.    Marland Kitchen DIOVAN PO Oral Take by mouth daily.    Marland Kitchen DIPHENOXYLATE-ATROPINE 2.5-0.025 MG PO TABS Oral Take 1 tablet by mouth 4 (four) times daily as needed for diarrhea or loose stools. 15 tablet 0  . ONDANSETRON HCL 4 MG PO TABS Oral Take 1 tablet (4 mg total) by mouth every 6 (six) hours. 12 tablet 0    BP 127/78  Pulse 106  Temp(Src) 98.7 F (37.1 C) (Oral)  Resp 20  SpO2 96%  Physical Exam  Nursing note and vitals  reviewed. Constitutional: He appears well-developed and well-nourished.  Non-toxic appearance. He has a sickly appearance. He does not appear ill. No distress.  HENT:  Head: Normocephalic.  Mouth/Throat: Uvula is midline. Mucous membranes are dry. No tonsillar abscesses.  Eyes: Conjunctivae are normal.  Neck: Neck supple. No JVD present. No thyromegaly present.  Abdominal: Soft. He exhibits no distension and no mass. There is no tenderness. There is no rebound and no guarding.  Lymphadenopathy:    He has no cervical adenopathy.  Skin: No rash noted. He is not diaphoretic.    ED Course  Procedures (including critical care time)  Labs Reviewed - No data to display No results found.   1. Diarrhea       MDM  Patient with nausea vomiting diarrhea for the last 24 hours and a headache. With tactile fevers. Patient denies any abdominal pain at rest have had some cramps and discomfort associated with diarrheas have had about 5-7 episodes nonbloody. Patient looks comfortable, no further neurological symptoms. Patient mucosa seem mildly dry. Soft abdomen.      Jimmie Molly, MD 01/01/12 1900

## 2012-01-01 NOTE — Discharge Instructions (Signed)
As discussed your symptoms and exam were consistent with most likely a viral process. For the next 24-48 hours take this medicines for symptoms and try to rehydrate herself with oralyte or equivalent a cold rehydration salts. Monitor temperatures the next 24 hours. If symptoms worsen or localize abdominal pain establishes require further evaluation.    Clear Liquid Diet The clear liquid dietconsists of foods that are liquid or will become liquid at room temperature.You should be able to see through the liquid and beverages. Examples of foods allowed on a clear liquid diet include fruit juice, broth or bouillon, gelatin, or frozen ice pops. The purpose of this diet is to provide necessary fluid, electrolytes such as sodium and potassium, and energy to keep the body functioning during times when you are not able to consume a regular diet.A clear liquid diet should not be continued for long periods of time as it is not nutritionally adequate.  REASONS FOR USING A CLEAR LIQUID DIET  In sudden onset (acute) conditions for a patient before or after surgery.   As the first step in oral feeding.   For fluid and electrolyte replacement in diarrheal diseases.   As a diet before certain medical tests are performed.  ADEQUACY The clear liquid diet is adequate only in ascorbic acid, according to the Recommended Dietary Allowances of the [employer]. CHOOSING FOODS Breads and Starches  Allowed:  None are allowed.   Avoid: All are avoided.  Vegetables  Allowed:  Strained tomato or vegetable juice.   Avoid: Any others.  Fruit  Allowed:  Strained fruit juices and fruit drinks. Include 1 serving of citrus or vitamin C-enriched fruit juice daily.   Avoid: Any others.  Meat and Meat Substitutes  Allowed:  None are allowed.   Avoid: All are avoided.  Milk  Allowed:  None are allowed.   Avoid: All are avoided.  Soups and Combination Foods  Allowed:  Clear bouillon, broth,  or strained broth-based soups.   Avoid: Any others.  Desserts and Sweets  Allowed:  Sugar, honey. High protein gelatin. Flavored gelatin, ices, or frozen ice pops that do not contain milk.   Avoid: Any others.  Fats and Oils  Allowed:  None are allowed.   Avoid: All are avoided.  Beverages  Allowed: Cereal beverages, coffee (regular or decaffeinated), tea, or soda at the discretion of your caregiver.   Avoid: Any others.  Condiments  Allowed:  Iodized salt.   Avoid: Any others, including pepper.  Supplements  Allowed:  Liquid nutrition beverages.   Avoid: Any others that contain lactose or fiber.  SAMPLE MEAL PLAN Breakfast  4 oz (120 mL) strained orange juice.    to 1 cup (125 to 250 mL) gelatin (plain or fortified).   1 cup (250 mL) beverage (coffee or tea).   Sugar, if desired.  Midmorning Snack   cup (125 mL) gelatin (plain or fortified).  Lunch  1 cup (250 mL) broth or consomm.   4 oz (120 mL) strained grapefruit juice.    cup (125 mL) gelatin (plain or fortified).   1 cup (250 mL) beverage (coffee or tea).   Sugar, if desired.  Midafternoon Snack   cup (125 mL) fruit ice.    cup (125 mL) strained fruit juice.  Dinner  1 cup (250 mL) broth or consomm.    cup (125 mL) cranberry juice.    cup (125 mL) flavored gelatin (plain or fortified).   1 cup (250 mL) beverage (coffee or  tea).   Sugar, if desired.  Evening Snack  4 oz (120 mL) strained apple juice (vitamin C-fortified).    cup (125 mL) flavored gelatin (plain or fortified).  Document Released: 09/19/2005 Document Revised: 09/08/2011 Document Reviewed: 12/17/2010 [facility] Patient Information 2012 [place], [place].

## 2012-01-01 NOTE — ED Notes (Signed)
C/O n/v/d and HA since last night approx 2000.  C/O watery stools; able to keep down some PO fluids, but doesn't want to drink "because it goes right through me".  Denies any blood in stool or emesis.  Has felt feverish.  Denies any abd pain.

## 2012-09-24 ENCOUNTER — Encounter (HOSPITAL_COMMUNITY): Payer: Self-pay | Admitting: Emergency Medicine

## 2012-09-24 ENCOUNTER — Emergency Department (HOSPITAL_COMMUNITY)
Admission: EM | Admit: 2012-09-24 | Discharge: 2012-09-25 | Disposition: A | Discharge status: Home / Self Care | Payer: PRIVATE HEALTH INSURANCE | Attending: Emergency Medicine | Admitting: Emergency Medicine

## 2012-09-24 ENCOUNTER — Emergency Department (HOSPITAL_COMMUNITY): Payer: PRIVATE HEALTH INSURANCE

## 2012-09-24 DIAGNOSIS — I10 Essential (primary) hypertension: Secondary | ICD-10-CM | POA: Insufficient documentation

## 2012-09-24 DIAGNOSIS — E7800 Pure hypercholesterolemia, unspecified: Secondary | ICD-10-CM | POA: Insufficient documentation

## 2012-09-24 DIAGNOSIS — R5381 Other malaise: Secondary | ICD-10-CM | POA: Insufficient documentation

## 2012-09-24 DIAGNOSIS — Z7982 Long term (current) use of aspirin: Secondary | ICD-10-CM | POA: Insufficient documentation

## 2012-09-24 DIAGNOSIS — Z8669 Personal history of other diseases of the nervous system and sense organs: Secondary | ICD-10-CM | POA: Insufficient documentation

## 2012-09-24 DIAGNOSIS — R42 Dizziness and giddiness: Secondary | ICD-10-CM

## 2012-09-24 DIAGNOSIS — R110 Nausea: Secondary | ICD-10-CM | POA: Insufficient documentation

## 2012-09-24 DIAGNOSIS — K219 Gastro-esophageal reflux disease without esophagitis: Secondary | ICD-10-CM | POA: Insufficient documentation

## 2012-09-24 DIAGNOSIS — I2510 Atherosclerotic heart disease of native coronary artery without angina pectoris: Secondary | ICD-10-CM | POA: Insufficient documentation

## 2012-09-24 DIAGNOSIS — Z79899 Other long term (current) drug therapy: Secondary | ICD-10-CM | POA: Insufficient documentation

## 2012-09-24 HISTORY — DX: Atherosclerotic heart disease of native coronary artery without angina pectoris: I25.10

## 2012-09-24 LAB — CBC WITH DIFFERENTIAL/PLATELET
Basophils Absolute: 0 10*3/uL (ref 0.0–0.1)
Eosinophils Relative: 4 % (ref 0–5)
HCT: 37.8 % — ABNORMAL LOW (ref 39.0–52.0)
Lymphocytes Relative: 29 % (ref 12–46)
Lymphs Abs: 1.9 10*3/uL (ref 0.7–4.0)
MCV: 81.8 fL (ref 78.0–100.0)
Monocytes Absolute: 0.5 10*3/uL (ref 0.1–1.0)
Neutro Abs: 4 10*3/uL (ref 1.7–7.7)
Platelets: 217 10*3/uL (ref 150–400)
RBC: 4.62 MIL/uL (ref 4.22–5.81)
WBC: 6.7 10*3/uL (ref 4.0–10.5)

## 2012-09-24 LAB — BASIC METABOLIC PANEL
CO2: 24 mEq/L (ref 19–32)
Calcium: 9.3 mg/dL (ref 8.4–10.5)
Chloride: 103 mEq/L (ref 96–112)
Glucose, Bld: 113 mg/dL — ABNORMAL HIGH (ref 70–99)
Sodium: 139 mEq/L (ref 135–145)

## 2012-09-24 LAB — POCT I-STAT TROPONIN I: Troponin i, poc: 0 ng/mL (ref 0.00–0.08)

## 2012-09-24 MED ORDER — MECLIZINE HCL 25 MG PO TABS
25.0000 mg | ORAL_TABLET | Freq: Once | ORAL | Status: AC
  Administered 2012-09-24: 25 mg via ORAL
  Filled 2012-09-24: qty 1

## 2012-09-24 MED ORDER — MECLIZINE HCL 25 MG PO TABS: 25.0000 mg | ORAL_TABLET | Freq: Four times a day (QID) | ORAL | Status: DC

## 2012-09-24 NOTE — ED Notes (Signed)
Pt c/o generalized weakness, dizziness and N/V x 2 hours; pt denies CP or SOB but sts diaphoresis

## 2012-09-24 NOTE — ED Provider Notes (Signed)
History     CSN: [id number]  Arrival date & time 09/24/12  9528   First MD Initiated Contact with Patient 09/24/12 2140      Chief Complaint  Patient presents with  . Dizziness  . Emesis  . Weakness    (Consider location/radiation/quality/duration/timing/severity/associated sxs/prior treatment) HPI Comments: Logan Orozco developed a feeling of dizziness and sweating about an hour prior to ED visit.  He had vomiting at home, so his daughter brought him to [facility] ED for evaluation.  When he is lying still he is asymptomatic.  Changes of position bring on dizziness and vomiting.  He is unaware of any precipitating cause of his illness.  There has been no recent URI or injury.  Patient is a 77 y.o. male presenting with neurologic complaint. The history is provided by the patient and medical records. No language interpreter was used.  Neurologic Problem The primary symptoms include dizziness. The symptoms began 1 to 2 hours ago. Episode duration: Occurs with change of position. The symptoms are unchanged. The neurological symptoms are diffuse.  Associated symptoms comments: Vomiting..    Past Medical History  Diagnosis Date  . GERD (gastroesophageal reflux disease)   . Hypercholesteremia   . Hypertension   . Glaucoma(365)   . Cataract   . Coronary artery disease     Past Surgical History  Procedure Date  . Cataract extraction     x2    History reviewed. No pertinent family history.  History  Substance Use Topics  . Smoking status: Never Smoker   . Smokeless tobacco: Not on file  . Alcohol Use: No      Review of Systems  Constitutional: Negative.   HENT: Negative.   Eyes: Negative.   Respiratory: Negative.   Cardiovascular: Negative.   Gastrointestinal: Negative.   Genitourinary: Negative.   Musculoskeletal: Negative.   Skin: Negative.   Neurological: Positive for dizziness.  Psychiatric/Behavioral: Negative.     Allergies  Review of patient's allergies  indicates no known allergies.  Home Medications   Current Outpatient Rx  Name  Route  Sig  Dispense  Refill  . AMLODIPINE BESYLATE PO   Oral   Take by mouth daily.         . ASPIRIN 81 MG PO TABS   Oral   Take 81 mg by mouth daily.         . ATORVASTATIN CALCIUM PO   Oral   Take by mouth daily.         Marland Kitchen LUMIGAN OP   Ophthalmic   Apply to eye.         . DORZOLAMIDE HCL 2 % OP SOLN      1 drop 3 (three) times daily.         Marland Kitchen DOXAZOSIN MESYLATE PO   Oral   Take by mouth at bedtime.         Marland Kitchen METOPROLOL TARTRATE PO   Oral   Take by mouth daily.         Marland Kitchen OMEPRAZOLE PO   Oral   Take by mouth 2 (two) times daily.         Marland Kitchen TIMOLOL HEMIHYDRATE 0.5 % OP SOLN      1 drop 2 (two) times daily.         Marland Kitchen DIOVAN PO   Oral   Take by mouth daily.           BP 143/69  Pulse 76  Temp 97.6 F (36.4  C) (Oral)  Resp 20  SpO2 100%  Physical Exam  Nursing note and vitals reviewed. Constitutional: He is oriented to person, place, and time. He appears well-developed and well-nourished. No distress.       No distress at rest.  HENT:  Head: Normocephalic and atraumatic.  Right Ear: External ear normal.  Left Ear: External ear normal.  Mouth/Throat: Oropharynx is clear and moist.  Eyes: Conjunctivae normal and EOM are normal. Pupils are equal, round, and reactive to light.       No nystagmus.  Neck: Normal range of motion. Neck supple.       No bruits.  Cardiovascular: Normal rate, regular rhythm and normal heart sounds.   Pulmonary/Chest: Effort normal and breath sounds normal.  Abdominal: Soft. Bowel sounds are normal.  Musculoskeletal: Normal range of motion. He exhibits no edema and no tenderness.  Neurological: He is alert and oriented to person, place, and time.       No sensory or motor deficit.  Skin: Skin is warm and dry.  Psychiatric: He has a normal mood and affect. His behavior is normal.    ED Course  Procedures (including  critical care time)  Labs Reviewed  CBC WITH DIFFERENTIAL - Abnormal; Notable for the following:    Hemoglobin 12.3 (*)     HCT 37.8 (*)     All other components within normal limits  BASIC METABOLIC PANEL - Abnormal; Notable for the following:    Glucose, Bld 113 (*)     GFR calc non Af Amer 67 (*)     GFR calc Af Amer 77 (*)     All other components within normal limits  POCT I-STAT TROPONIN I   Dg Chest 2 View  09/24/2012  *RADIOLOGY REPORT*  Clinical Data: Dizziness, weakness  CHEST - 2 VIEW  Comparison: None.  Findings: Lungs are clear. No pleural effusion or pneumothorax.  Cardiomediastinal silhouette is within normal limits.  Mild degenerative changes of the visualized thoracolumbar spine.  IMPRESSION: No evidence of acute cardiopulmonary disease.   Original Report Authenticated By: Charline Bills, M.D.    10:08 PM  Date: 09/24/2012  Rate: 70  Rhythm: normal sinus rhythm  QRS Axis: normal  Intervals: normal QRS:  Poor R wave progression in precordial leads suggests old anterior myocardial infarction.  ST/T Wave abnormalities: normal  Conduction Disutrbances:none  Narrative Interpretation: Abnormal EKG  Old EKG Reviewed: unchanged  Lab workup was negative.  Pt was treated with Antivert 25 mg po.  After workup, he felt better.  Orthostatic VS were good.  Released on Antivert 25 mg qid x 7 days.  1. Vertigo           Carleene Cooper III, MD 09/25/12 1150

## 2013-01-11 ENCOUNTER — Encounter (HOSPITAL_COMMUNITY): Payer: Self-pay | Admitting: Cardiology

## 2013-01-11 ENCOUNTER — Emergency Department (HOSPITAL_COMMUNITY)
Admission: EM | Admit: 2013-01-11 | Discharge: 2013-01-11 | Disposition: A | Discharge status: Home / Self Care | Payer: PRIVATE HEALTH INSURANCE | Attending: Emergency Medicine | Admitting: Emergency Medicine

## 2013-01-11 ENCOUNTER — Emergency Department (HOSPITAL_COMMUNITY): Payer: PRIVATE HEALTH INSURANCE

## 2013-01-11 DIAGNOSIS — Z7982 Long term (current) use of aspirin: Secondary | ICD-10-CM | POA: Insufficient documentation

## 2013-01-11 DIAGNOSIS — Z9849 Cataract extraction status, unspecified eye: Secondary | ICD-10-CM | POA: Insufficient documentation

## 2013-01-11 DIAGNOSIS — I2510 Atherosclerotic heart disease of native coronary artery without angina pectoris: Secondary | ICD-10-CM | POA: Insufficient documentation

## 2013-01-11 DIAGNOSIS — H409 Unspecified glaucoma: Secondary | ICD-10-CM | POA: Insufficient documentation

## 2013-01-11 DIAGNOSIS — Z87448 Personal history of other diseases of urinary system: Secondary | ICD-10-CM | POA: Insufficient documentation

## 2013-01-11 DIAGNOSIS — M79609 Pain in unspecified limb: Secondary | ICD-10-CM | POA: Insufficient documentation

## 2013-01-11 DIAGNOSIS — E7800 Pure hypercholesterolemia, unspecified: Secondary | ICD-10-CM | POA: Insufficient documentation

## 2013-01-11 DIAGNOSIS — I10 Essential (primary) hypertension: Secondary | ICD-10-CM | POA: Insufficient documentation

## 2013-01-11 DIAGNOSIS — K219 Gastro-esophageal reflux disease without esophagitis: Secondary | ICD-10-CM | POA: Insufficient documentation

## 2013-01-11 DIAGNOSIS — Z79899 Other long term (current) drug therapy: Secondary | ICD-10-CM | POA: Insufficient documentation

## 2013-01-11 DIAGNOSIS — IMO0002 Reserved for concepts with insufficient information to code with codable children: Secondary | ICD-10-CM | POA: Insufficient documentation

## 2013-01-11 DIAGNOSIS — M549 Dorsalgia, unspecified: Secondary | ICD-10-CM

## 2013-01-11 LAB — URINALYSIS, ROUTINE W REFLEX MICROSCOPIC
Glucose, UA: NEGATIVE mg/dL
Ketones, ur: NEGATIVE mg/dL
Protein, ur: NEGATIVE mg/dL
Urobilinogen, UA: 0.2 mg/dL (ref 0.0–1.0)

## 2013-01-11 LAB — CBC WITH DIFFERENTIAL/PLATELET
HCT: 39.1 % (ref 39.0–52.0)
Hemoglobin: 13.7 g/dL (ref 13.0–17.0)
Lymphocytes Relative: 26 % (ref 12–46)
Lymphs Abs: 1.7 10*3/uL (ref 0.7–4.0)
MCHC: 35 g/dL (ref 30.0–36.0)
Monocytes Absolute: 0.6 10*3/uL (ref 0.1–1.0)
Monocytes Relative: 10 % (ref 3–12)
Neutro Abs: 3.8 10*3/uL (ref 1.7–7.7)
Neutrophils Relative %: 59 % (ref 43–77)
RBC: 4.87 MIL/uL (ref 4.22–5.81)

## 2013-01-11 LAB — COMPREHENSIVE METABOLIC PANEL
Albumin: 3.8 g/dL (ref 3.5–5.2)
Alkaline Phosphatase: 106 U/L (ref 39–117)
BUN: 14 mg/dL (ref 6–23)
CO2: 26 mEq/L (ref 19–32)
Chloride: 107 mEq/L (ref 96–112)
Creatinine, Ser: 1.02 mg/dL (ref 0.50–1.35)
GFR calc Af Amer: 81 mL/min — ABNORMAL LOW (ref >90)
GFR calc non Af Amer: 70 mL/min — ABNORMAL LOW (ref >90)
Glucose, Bld: 124 mg/dL — ABNORMAL HIGH (ref 70–99)
Potassium: 4.7 mEq/L (ref 3.5–5.1)
Total Bilirubin: 0.7 mg/dL (ref 0.3–1.2)

## 2013-01-11 MED ORDER — IBUPROFEN 600 MG PO TABS: 600.0000 mg | ORAL_TABLET | Freq: Three times a day (TID) | ORAL | Status: DC

## 2013-01-11 MED ORDER — TRAMADOL HCL 50 MG PO TABS: 50.0000 mg | ORAL_TABLET | Freq: Three times a day (TID) | ORAL | Status: AC | PRN

## 2013-01-11 NOTE — ED Provider Notes (Signed)
History     CSN: [id number]  Arrival date & time 01/11/13  0841   First MD Initiated Contact with Patient 01/11/13 [phone]      Chief Complaint  Patient presents with  . Flank Pain  . Leg Pain    (Consider location/radiation/quality/duration/timing/severity/associated sxs/prior treatment) HPI  The patient presents with right-sided flank and hip pain.  Pain began approximately 4 days ago, without clear precipitant.  Since onset has been pain focally at the right superior hip, right flank.  The pain radiates down the right leg both anteriorly and posteriorly to mid thigh.  The pain is worse with motion, standing. The patient was started on new analgesics by his primary care physician several days ago, notes that he has had no improvement since that medication can. He denies concurrent fevers, chills, nausea, anorexia, vomiting, diarrhea, dysuria, hematuria, bowel movement changes. He has a history of hypertension, prostate problem.   Past Medical History  Diagnosis Date  . GERD (gastroesophageal reflux disease)   . Hypercholesteremia   . Hypertension   . Glaucoma(365)   . Cataract   . Coronary artery disease     Past Surgical History  Procedure Laterality Date  . Cataract extraction      x2    History reviewed. No pertinent family history.  History  Substance Use Topics  . Smoking status: Never Smoker   . Smokeless tobacco: Not on file  . Alcohol Use: No      Review of Systems  All other systems reviewed and are negative.    Allergies  Review of patient's allergies indicates no known allergies.  Home Medications   Current Outpatient Rx  Name  Route  Sig  Dispense  Refill  . amLODipine (NORVASC) 10 MG tablet   Oral   Take 10 mg by mouth daily.         Marland Kitchen aspirin 81 MG tablet   Oral   Take 81 mg by mouth daily.         Marland Kitchen atorvastatin (LIPITOR) 20 MG tablet   Oral   Take 20 mg by mouth daily.         . dorzolamide-timolol (COSOPT) 22.3-6.8 MG/ML  ophthalmic solution   Both Eyes   Place 1 drop into both eyes 2 (two) times daily.         Marland Kitchen doxazosin (CARDURA) 4 MG tablet   Oral   Take 4 mg by mouth at bedtime.         Marland Kitchen latanoprost (XALATAN) 0.005 % ophthalmic solution   Both Eyes   Place 1 drop into both eyes daily.         Marland Kitchen losartan (COZAAR) 100 MG tablet   Oral   Take 100 mg by mouth daily.         . metoprolol succinate (TOPROL-XL) 25 MG 24 hr tablet   Oral   Take 25 mg by mouth daily.         . nitroGLYCERIN (NITROSTAT) 0.4 MG SL tablet   Sublingual   Place 0.4 mg under the tongue every 5 (five) minutes as needed. For chest pain         . omeprazole (PRILOSEC) 20 MG capsule   Oral   Take 20 mg by mouth daily.           BP 139/76  Pulse 66  Temp(Src) 97.2 F (36.2 C) (Oral)  Resp 18  SpO2 99%  Physical Exam  Nursing note and vitals reviewed. Constitutional: He  is oriented to person, place, and time. He appears well-developed. No distress.  HENT:  Head: Normocephalic and atraumatic.  Eyes: Conjunctivae and EOM are normal.  Cardiovascular: Normal rate and regular rhythm.   Pulmonary/Chest: Effort normal. No stridor. No respiratory distress.  Abdominal: Soft. Bowel sounds are normal. He exhibits no distension. There is no tenderness. There is no rebound and no guarding.  Musculoskeletal: He exhibits no edema.  Negative straight leg test bilaterally. Hips and pelvis are stable, unremarkable  Neurological: He is alert and oriented to person, place, and time. No cranial nerve deficit. He exhibits normal muscle tone.  Skin: Skin is warm and dry.  Psychiatric: He has a normal mood and affect.    ED Course  Procedures (including critical care time)  Labs Reviewed  CBC WITH DIFFERENTIAL  COMPREHENSIVE METABOLIC PANEL  URINALYSIS, ROUTINE W REFLEX MICROSCOPIC   No results found.   No diagnosis found.  11:05 AM On repeat exam the patient appears in no distress.  Discussed the results  without significant abnormalities medication was started, but no antibiotics.  MDM  Patient presents with new right sided pain that radiates down the right leg.  On exam he is in no distress.  This denial of any concurrent GI complaints, concurrent urinary complaints his reassuring.  The patient's x-ray does demonstrate degenerative changes, and given his description of pain radiating down the leg there suspicion for radiculopathy.  The patient had additional analgesics provided, was made aware of the likely natural course of this type of problem.  He was discharged to follow up with his primary care physician and an orthopedist.       Gerhard Munch, MD 01/11/13 1106

## 2013-01-11 NOTE — ED Notes (Signed)
Pt reports right flank pain and leg pain for the past couple of days. States he has increased pain when he stands in that right leg. Denies any urinary symptoms. No distress noted.

## 2013-01-11 NOTE — ED Notes (Signed)
Patient transported to X-ray 

## 2013-01-11 NOTE — ED Notes (Signed)
C/o right lumbar pain radiating down RLE x 3-4 days. Denies injury. Has been ambulating. States increase pain with ambulation & weight bearing. No obvious deformity

## 2013-07-11 ENCOUNTER — Other Ambulatory Visit: Payer: Self-pay | Admitting: Cardiovascular Disease

## 2013-07-11 ENCOUNTER — Ambulatory Visit
Admission: RE | Admit: 2013-07-11 | Discharge: 2013-07-11 | Disposition: A | Discharge status: Home / Self Care | Payer: PRIVATE HEALTH INSURANCE | Source: Ambulatory Visit | Attending: Cardiovascular Disease | Admitting: Cardiovascular Disease

## 2013-07-11 DIAGNOSIS — R079 Chest pain, unspecified: Secondary | ICD-10-CM

## 2013-07-11 DIAGNOSIS — R0602 Shortness of breath: Secondary | ICD-10-CM

## 2014-03-10 ENCOUNTER — Other Ambulatory Visit (HOSPITAL_COMMUNITY): Payer: Self-pay | Admitting: Cardiology

## 2014-03-10 DIAGNOSIS — R079 Chest pain, unspecified: Secondary | ICD-10-CM

## 2014-03-24 ENCOUNTER — Encounter (HOSPITAL_COMMUNITY)
Admission: RE | Admit: 2014-03-24 | Discharge: 2014-03-24 | Disposition: A | Discharge status: Home / Self Care | Payer: Medicare Other | Source: Ambulatory Visit | Attending: Cardiology | Admitting: Cardiology

## 2014-03-24 ENCOUNTER — Other Ambulatory Visit: Payer: Self-pay

## 2014-03-24 DIAGNOSIS — R0789 Other chest pain: Secondary | ICD-10-CM

## 2014-03-24 DIAGNOSIS — R079 Chest pain, unspecified: Secondary | ICD-10-CM | POA: Insufficient documentation

## 2014-03-24 MED ORDER — TECHNETIUM TC 99M SESTAMIBI GENERIC - CARDIOLITE: 30.0000 | Freq: Once | INTRAVENOUS | Status: DC | PRN

## 2014-03-24 MED ORDER — TECHNETIUM TC 99M SESTAMIBI GENERIC - CARDIOLITE
10.0000 | Freq: Once | INTRAVENOUS | Status: AC | PRN
  Administered 2014-03-24: 10 via INTRAVENOUS

## 2014-03-24 MED ORDER — REGADENOSON 0.4 MG/5ML IV SOLN
INTRAVENOUS | Status: AC
  Filled 2014-03-24: qty 5

## 2014-03-24 MED ORDER — TECHNETIUM TC 99M SESTAMIBI GENERIC - CARDIOLITE
30.0000 | Freq: Once | INTRAVENOUS | Status: AC | PRN
  Administered 2014-03-24: 30 via INTRAVENOUS

## 2014-03-24 MED ORDER — REGADENOSON 0.4 MG/5ML IV SOLN
0.4000 mg | Freq: Once | INTRAVENOUS | Status: AC
  Administered 2014-03-24: 0.4 mg via INTRAVENOUS

## 2014-05-10 ENCOUNTER — Encounter: Payer: Self-pay | Admitting: *Deleted

## 2014-08-08 ENCOUNTER — Encounter (HOSPITAL_COMMUNITY): Payer: Self-pay | Admitting: Emergency Medicine

## 2014-08-08 ENCOUNTER — Emergency Department (HOSPITAL_COMMUNITY)
Admission: EM | Admit: 2014-08-08 | Discharge: 2014-08-08 | Disposition: A | Discharge status: Home / Self Care | Payer: PRIVATE HEALTH INSURANCE | Source: Home / Self Care | Attending: Family Medicine | Admitting: Family Medicine

## 2014-08-08 DIAGNOSIS — R42 Dizziness and giddiness: Secondary | ICD-10-CM

## 2014-08-08 MED ORDER — MECLIZINE HCL 25 MG PO TABS: 25.0000 mg | ORAL_TABLET | Freq: Four times a day (QID) | ORAL | Status: DC

## 2014-08-08 NOTE — Discharge Instructions (Signed)
Benign Positional Vertigo Vertigo means you feel like you or your surroundings are moving when they are not. Benign positional vertigo is the most common form of vertigo. Benign means that the cause of your condition is not serious. Benign positional vertigo is more common in older adults. CAUSES  Benign positional vertigo is the result of an upset in the labyrinth system. This is an area in the middle ear that helps control your balance. This may be caused by a viral infection, head injury, or repetitive motion. However, often no specific cause is found. SYMPTOMS  Symptoms of benign positional vertigo occur when you move your head or eyes in different directions. Some of the symptoms may include:  Loss of balance and falls.  Vomiting.  Blurred vision.  Dizziness.  Nausea.  Involuntary eye movements (nystagmus). DIAGNOSIS  Benign positional vertigo is usually diagnosed by physical exam. If the specific cause of your benign positional vertigo is unknown, your caregiver may perform imaging tests, such as magnetic resonance imaging (MRI) or computed tomography (CT). TREATMENT  Your caregiver may recommend movements or procedures to correct the benign positional vertigo. Medicines such as meclizine, benzodiazepines, and medicines for nausea may be used to treat your symptoms. In rare cases, if your symptoms are caused by certain conditions that affect the inner ear, you may need surgery. HOME CARE INSTRUCTIONS   Follow your caregiver's instructions.  Move slowly. Do not make sudden body or head movements.  Avoid driving.  Avoid operating heavy machinery.  Avoid performing any tasks that would be dangerous to you or others during a vertigo episode.  Drink enough fluids to keep your urine clear or pale yellow. SEEK IMMEDIATE MEDICAL CARE IF:   You develop problems with walking, weakness, numbness, or using your arms, hands, or legs.  You have difficulty speaking.  You develop  severe headaches.  Your nausea or vomiting continues or gets worse.  You develop visual changes.  Your family or friends notice any behavioral changes.  Your condition gets worse.  You have a fever.  You develop a stiff neck or sensitivity to light. MAKE SURE YOU:   Understand these instructions.  Will watch your condition.  Will get help right away if you are not doing well or get worse. Document Released: 06/27/2006 Document Revised: 12/12/2011 Document Reviewed: 06/09/2011 [employer] Patient Information 2015 [employer]. This information is not intended to replace advice given to you by your health care provider. Make sure you discuss any questions you have with your health care provider.    

## 2014-08-08 NOTE — ED Provider Notes (Signed)
CSN: [id number]     Arrival date & time 08/08/14  1913 History   First MD Initiated Contact with Patient 08/08/14 1950     Chief Complaint  Patient presents with  . Dizziness   (Consider location/radiation/quality/duration/timing/severity/associated sxs/prior Treatment) HPI     79 year old male presents complaining of dizziness. This started approximately 4 hours ago. He has very slight sensation of the room spinning around him. This is worse with any movement and almost completely resolves with sitting still. He had this once in the past, 2 years ago, although he remembers that time being much worse than how he is now. He describes this as very mild. Denies any systemic symptoms. He has a history of coronary artery disease, no history of neurovascular disease.no falls. No history of arrhythmia.  Past Medical History  Diagnosis Date  . GERD (gastroesophageal reflux disease)   . Hypercholesteremia   . Hypertension   . Glaucoma   . Cataract   . Coronary artery disease   . PVC's (premature ventricular contractions)    Past Surgical History  Procedure Laterality Date  . Cataract extraction      x2   Family History  Problem Relation Age of Onset  . Cancer Sister     breast  . Heart disease Brother   . Diabetes Other    History  Substance Use Topics  . Smoking status: Never Smoker   . Smokeless tobacco: Not on file  . Alcohol Use: No    Review of Systems  Neurological: Positive for dizziness.  All other systems reviewed and are negative.   Allergies  Tetracyclines & related  Home Medications   Prior to Admission medications   Medication Sig Start Date End Date Taking? Authorizing Provider  amLODipine (NORVASC) 10 MG tablet Take 10 mg by mouth daily.   Yes Historical Provider, MD  aspirin 81 MG tablet Take 81 mg by mouth daily.   Yes Historical Provider, MD  atorvastatin (LIPITOR) 20 MG tablet Take 20 mg by mouth daily.   Yes Historical Provider, MD  dorzolamide-timolol  (COSOPT) 22.3-6.8 MG/ML ophthalmic solution Place 1 drop into both eyes 2 (two) times daily.   Yes Historical Provider, MD  doxazosin (CARDURA) 4 MG tablet Take 4 mg by mouth at bedtime.   Yes Historical Provider, MD  losartan (COZAAR) 100 MG tablet Take 100 mg by mouth daily.   Yes Historical Provider, MD  metoprolol succinate (TOPROL-XL) 25 MG 24 hr tablet Take 25 mg by mouth daily.   Yes Historical Provider, MD  omeprazole (PRILOSEC) 20 MG capsule Take 20 mg by mouth daily.   Yes Historical Provider, MD  ibuprofen (ADVIL,MOTRIN) 600 MG tablet Take 1 tablet (600 mg total) by mouth 3 (three) times daily. 01/11/13   Carmin Muskrat, MD  latanoprost (XALATAN) 0.005 % ophthalmic solution Place 1 drop into both eyes daily.    Historical Provider, MD  meclizine (ANTIVERT) 25 MG tablet Take 1 tablet (25 mg total) by mouth 4 (four) times daily. 08/08/14   Liam Graham, PA-C  nitroGLYCERIN (NITROSTAT) 0.4 MG SL tablet Place 0.4 mg under the tongue every 5 (five) minutes as needed. For chest pain    Historical Provider, MD  traMADol (ULTRAM) 50 MG tablet Take 1 tablet (50 mg total) by mouth every 8 (eight) hours as needed for pain. 01/11/13   Carmin Muskrat, MD   BP 142/69 mmHg  Pulse 69  Temp(Src) 98.1 F (36.7 C) (Oral)  Resp 18  SpO2 100% Physical Exam  Constitutional: He is oriented to person, place, and time. He appears well-developed and well-nourished. No distress.  HENT:  Head: Normocephalic and atraumatic.  Right Ear: Hearing, tympanic membrane, external ear and ear canal normal.  Left Ear: Hearing, tympanic membrane, external ear and ear canal normal.  Eyes: EOM are normal. Pupils are equal, round, and reactive to light.  Pulmonary/Chest: Effort normal. No respiratory distress.  Neurological: He is alert and oriented to person, place, and time. He has normal strength and normal reflexes. He is not disoriented. No cranial nerve deficit or sensory deficit. He exhibits normal muscle tone. He  displays a negative Romberg sign. Coordination and gait normal. GCS eye subscore is 4. GCS verbal subscore is 5. GCS motor subscore is 6.  Difficulty with performing heel-to-toe gait. Otherwise, walking gait is normaland Romberg is negative. Cranial nerves II through XII are intact. He is able to perform rapid alternating hand movements without difficulty. Finger to nose and heel to shin are both normal.  Skin: Skin is warm and dry. No rash noted. He is not diaphoretic.  Psychiatric: He has a normal mood and affect. Judgment normal.  Nursing note and vitals reviewed.   ED Course  Procedures (including critical care time) Labs Review Labs Reviewed - No data to display  Imaging Review No results found.   MDM   1. Vertigo    Consistent with benign vertigo. Treat with Antivert. He will come back tomorrow if that is not helping. ED return precautions discussed with the patient   Meds ordered this encounter  Medications  . meclizine (ANTIVERT) 25 MG tablet    Sig: Take 1 tablet (25 mg total) by mouth 4 (four) times daily.    Dispense:  12 tablet    Refill:  0    Order Specific Question:  Supervising Provider    Answer:  Ihor Gully D [place], PA-C 08/08/14 2005

## 2014-08-08 NOTE — ED Notes (Signed)
C/o dizziness onset 1600 today Feels like the room is spinning; sx also include nauseas Denies HA, blurry vision, LOC Alert, talking in complete sentences w/no signs of acute distress.

## 2014-09-22 ENCOUNTER — Observation Stay (HOSPITAL_COMMUNITY)
Admission: EM | Admit: 2014-09-22 | Discharge: 2014-09-22 | Disposition: A | Discharge status: Home / Self Care | Payer: Medicare Other | Attending: Cardiovascular Disease | Admitting: Cardiovascular Disease

## 2014-09-22 ENCOUNTER — Emergency Department (HOSPITAL_COMMUNITY): Payer: Medicare Other

## 2014-09-22 ENCOUNTER — Observation Stay (HOSPITAL_COMMUNITY): Payer: Medicare Other

## 2014-09-22 ENCOUNTER — Encounter (HOSPITAL_COMMUNITY): Payer: Self-pay | Admitting: *Deleted

## 2014-09-22 DIAGNOSIS — H409 Unspecified glaucoma: Secondary | ICD-10-CM | POA: Diagnosis not present

## 2014-09-22 DIAGNOSIS — K219 Gastro-esophageal reflux disease without esophagitis: Secondary | ICD-10-CM | POA: Insufficient documentation

## 2014-09-22 DIAGNOSIS — E780 Pure hypercholesterolemia: Secondary | ICD-10-CM | POA: Insufficient documentation

## 2014-09-22 DIAGNOSIS — R110 Nausea: Secondary | ICD-10-CM | POA: Diagnosis not present

## 2014-09-22 DIAGNOSIS — I493 Ventricular premature depolarization: Secondary | ICD-10-CM | POA: Insufficient documentation

## 2014-09-22 DIAGNOSIS — Z9889 Other specified postprocedural states: Secondary | ICD-10-CM | POA: Insufficient documentation

## 2014-09-22 DIAGNOSIS — R42 Dizziness and giddiness: Principal | ICD-10-CM

## 2014-09-22 DIAGNOSIS — I10 Essential (primary) hypertension: Secondary | ICD-10-CM | POA: Diagnosis not present

## 2014-09-22 DIAGNOSIS — Z7982 Long term (current) use of aspirin: Secondary | ICD-10-CM | POA: Insufficient documentation

## 2014-09-22 DIAGNOSIS — I2510 Atherosclerotic heart disease of native coronary artery without angina pectoris: Secondary | ICD-10-CM | POA: Insufficient documentation

## 2014-09-22 DIAGNOSIS — Z79899 Other long term (current) drug therapy: Secondary | ICD-10-CM | POA: Insufficient documentation

## 2014-09-22 HISTORY — DX: Dizziness and giddiness: R42

## 2014-09-22 LAB — CBC WITH DIFFERENTIAL/PLATELET
BASOS ABS: 0 10*3/uL (ref 0.0–0.1)
Basophils Relative: 0 % (ref 0–1)
EOS ABS: 0.3 10*3/uL (ref 0.0–0.7)
EOS PCT: 4 % (ref 0–5)
HCT: 39.1 % (ref 39.0–52.0)
Hemoglobin: 13.1 g/dL (ref 13.0–17.0)
LYMPHS ABS: 1.8 10*3/uL (ref 0.7–4.0)
LYMPHS PCT: 25 % (ref 12–46)
MCH: 27.9 pg (ref 26.0–34.0)
MCHC: 33.5 g/dL (ref 30.0–36.0)
MCV: 83.4 fL (ref 78.0–100.0)
Monocytes Absolute: 0.7 10*3/uL (ref 0.1–1.0)
Monocytes Relative: 9 % (ref 3–12)
Neutro Abs: 4.4 10*3/uL (ref 1.7–7.7)
Neutrophils Relative %: 62 % (ref 43–77)
PLATELETS: 228 10*3/uL (ref 150–400)
RBC: 4.69 MIL/uL (ref 4.22–5.81)
RDW: 13.8 % (ref 11.5–15.5)
WBC: 7.2 10*3/uL (ref 4.0–10.5)

## 2014-09-22 LAB — URINALYSIS, ROUTINE W REFLEX MICROSCOPIC
Bilirubin Urine: NEGATIVE
Glucose, UA: NEGATIVE mg/dL
Hgb urine dipstick: NEGATIVE
KETONES UR: NEGATIVE mg/dL
LEUKOCYTES UA: NEGATIVE
NITRITE: NEGATIVE
Protein, ur: NEGATIVE mg/dL
Specific Gravity, Urine: 1.02 (ref 1.005–1.030)
UROBILINOGEN UA: 1 mg/dL (ref 0.0–1.0)
pH: 7 (ref 5.0–8.0)

## 2014-09-22 LAB — BASIC METABOLIC PANEL
ANION GAP: 12 (ref 5–15)
BUN: 15 mg/dL (ref 6–23)
CALCIUM: 9.1 mg/dL (ref 8.4–10.5)
CO2: 25 meq/L (ref 19–32)
Chloride: 105 mEq/L (ref 96–112)
Creatinine, Ser: 1.08 mg/dL (ref 0.50–1.35)
GFR calc Af Amer: 74 mL/min — ABNORMAL LOW (ref >90)
GFR, EST NON AFRICAN AMERICAN: 64 mL/min — AB (ref >90)
Glucose, Bld: 131 mg/dL — ABNORMAL HIGH (ref 70–99)
Potassium: 4.2 mEq/L (ref 3.7–5.3)
Sodium: 142 mEq/L (ref 137–147)

## 2014-09-22 MED ORDER — AMLODIPINE BESYLATE 10 MG PO TABS
10.0000 mg | ORAL_TABLET | Freq: Every day | ORAL | Status: DC
  Administered 2014-09-22: 10 mg via ORAL
  Filled 2014-09-22: qty 1

## 2014-09-22 MED ORDER — NITROGLYCERIN 0.4 MG SL SUBL: 0.4000 mg | SUBLINGUAL_TABLET | SUBLINGUAL | Status: DC | PRN

## 2014-09-22 MED ORDER — PANTOPRAZOLE SODIUM 40 MG PO TBEC
40.0000 mg | DELAYED_RELEASE_TABLET | Freq: Every day | ORAL | Status: DC
  Administered 2014-09-22: 40 mg via ORAL
  Filled 2014-09-22: qty 1

## 2014-09-22 MED ORDER — MECLIZINE HCL 25 MG PO TABS: 25.0000 mg | ORAL_TABLET | Freq: Three times a day (TID) | ORAL | Status: AC | PRN

## 2014-09-22 MED ORDER — LATANOPROST 0.005 % OP SOLN
1.0000 [drp] | Freq: Every day | OPHTHALMIC | Status: DC
  Filled 2014-09-22: qty 2.5

## 2014-09-22 MED ORDER — DOCUSATE SODIUM 100 MG PO CAPS
100.0000 mg | ORAL_CAPSULE | Freq: Two times a day (BID) | ORAL | Status: DC
  Administered 2014-09-22: 100 mg via ORAL
  Filled 2014-09-22: qty 1

## 2014-09-22 MED ORDER — TRAMADOL HCL 50 MG PO TABS: 50.0000 mg | ORAL_TABLET | Freq: Three times a day (TID) | ORAL | Status: DC | PRN

## 2014-09-22 MED ORDER — HEPARIN SODIUM (PORCINE) 5000 UNIT/ML IJ SOLN: 5000.0000 [IU] | Freq: Three times a day (TID) | INTRAMUSCULAR | Status: DC

## 2014-09-22 MED ORDER — LOSARTAN POTASSIUM 50 MG PO TABS
100.0000 mg | ORAL_TABLET | Freq: Every day | ORAL | Status: DC
  Administered 2014-09-22: 100 mg via ORAL
  Filled 2014-09-22: qty 2

## 2014-09-22 MED ORDER — ONDANSETRON HCL 4 MG/2ML IJ SOLN: 4.0000 mg | Freq: Four times a day (QID) | INTRAMUSCULAR | Status: DC | PRN

## 2014-09-22 MED ORDER — DOXAZOSIN MESYLATE 4 MG PO TABS
4.0000 mg | ORAL_TABLET | Freq: Every day | ORAL | Status: DC
  Filled 2014-09-22: qty 1

## 2014-09-22 MED ORDER — SODIUM CHLORIDE 0.9 % IJ SOLN: 3.0000 mL | Freq: Two times a day (BID) | INTRAMUSCULAR | Status: DC

## 2014-09-22 MED ORDER — ALUM & MAG HYDROXIDE-SIMETH 200-200-20 MG/5ML PO SUSP: 30.0000 mL | Freq: Four times a day (QID) | ORAL | Status: DC | PRN

## 2014-09-22 MED ORDER — SODIUM CHLORIDE 0.9 % IV SOLN: INTRAVENOUS | Status: DC

## 2014-09-22 MED ORDER — ONDANSETRON HCL 4 MG PO TABS: 4.0000 mg | ORAL_TABLET | Freq: Four times a day (QID) | ORAL | Status: DC | PRN

## 2014-09-22 MED ORDER — DORZOLAMIDE HCL-TIMOLOL MAL 2-0.5 % OP SOLN
1.0000 [drp] | Freq: Two times a day (BID) | OPHTHALMIC | Status: DC
Start: 2014-09-22 — End: 2014-09-22
  Administered 2014-09-22: 1 [drp] via OPHTHALMIC
  Filled 2014-09-22: qty 10

## 2014-09-22 MED ORDER — MECLIZINE HCL 25 MG PO TABS
25.0000 mg | ORAL_TABLET | Freq: Four times a day (QID) | ORAL | Status: DC
  Administered 2014-09-22 (×3): 25 mg via ORAL
  Filled 2014-09-22 (×3): qty 1

## 2014-09-22 MED ORDER — ACETAMINOPHEN 325 MG PO TABS: 650.0000 mg | ORAL_TABLET | Freq: Four times a day (QID) | ORAL | Status: DC | PRN

## 2014-09-22 MED ORDER — ACETAMINOPHEN 650 MG RE SUPP: 650.0000 mg | Freq: Four times a day (QID) | RECTAL | Status: DC | PRN

## 2014-09-22 MED ORDER — METOPROLOL SUCCINATE ER 25 MG PO TB24
25.0000 mg | ORAL_TABLET | Freq: Every day | ORAL | Status: DC
  Administered 2014-09-22: 25 mg via ORAL
  Filled 2014-09-22: qty 1

## 2014-09-22 MED ORDER — ATORVASTATIN CALCIUM 20 MG PO TABS
20.0000 mg | ORAL_TABLET | Freq: Every day | ORAL | Status: DC
  Administered 2014-09-22: 20 mg via ORAL
  Filled 2014-09-22: qty 1

## 2014-09-22 MED ORDER — ASPIRIN EC 81 MG PO TBEC
81.0000 mg | DELAYED_RELEASE_TABLET | Freq: Every day | ORAL | Status: DC
  Administered 2014-09-22: 81 mg via ORAL
  Filled 2014-09-22: qty 1

## 2014-09-22 MED ORDER — ADULT MULTIVITAMIN W/MINERALS CH
1.0000 | ORAL_TABLET | Freq: Every day | ORAL | Status: DC
  Administered 2014-09-22: 1 via ORAL
  Filled 2014-09-22: qty 1

## 2014-09-22 NOTE — Discharge Summary (Signed)
Physician Discharge Summary  Patient ID: Logan Orozco MRN: [id number] DOB/AGE: 1935-05-27 79 y.o.  Admit date: 09/22/2014 Discharge date: 09/22/2014  Admission Diagnoses: Vertigo Hypertension Glaucoma CAD Hypercholesteremia  Discharge Diagnoses:  Principle Problems: *Chronic Vertigo* Hypertension Glaucoma CAD Hypercholesteremia Chronic Hyperglycemia  Discharged Condition: good  Hospital Course: 79 year old with past medical history of hypertension, hypercholesteremia, Glaucoma, coronary artery disease has dizziness increased with activity but not related with head movements or tinnitus or hearing impairment. Denies double vision, difficulty swallowing, unsteadiness, focal weakness or numbness, slurred speech, language or vision impairment. Ran out of meclizine that worked well in the past. He had neurology examination by Dr. Dorian Pod. His MRA/MRI head was negative for acute infarct and had mild chronic microvascular ischemic changes in the white matter. His meclizine was resumed and he was discharged home in stable condition. He will follow with Dr. Terrence Dupont in 2 weeks. He may have Hgb A 1C checked on OP basis per primary doctor  Consults: cardiology and Neurology  Significant Diagnostic Studies: radiology: MRI head and MRA head negative for acute infarct and had mild chronic microvascular ischemic changes in the white matter.  EKG-Normal sinus rhythm.  Treatments: cardiac meds: Aspirin, atorvastatin, Cardura, metoprolol and losartan. Meclizine for dizziness. Cosopt, Xalatan for glaucoma.  Discharge Exam: Blood pressure 112/51, pulse 65, temperature 98.5 F (36.9 C), temperature source Oral, resp. rate 16, height 5\' 10"  (1.778 m), weight 68 kg (149 lb 14.6 oz), SpO2 100 %. Physical Exam  Constitutional: Well-developed and well-nourished.No distress.  HENT: Normocephalic and atraumatic. Nose: Nose normal. Mouth/Throat: Oropharynx is clear and moist. No  oropharyngeal exudate. Eyes: Conjunctivae and EOM are normal. Pupils are equal, round, and reactive to light. No scleral icterus.  Neck: Normal range of motion. Neck supple. No tracheal deviation, no thyromegaly present.  Cardiovascular: Normal rate, regular rhythm, S1 normal, S2 normal, normal heart sounds, intact distal pulses and normal pulses. Exam reveals no gallop and no friction rub.II/VI systolic murmur heard. Pulmonary/Chest: Effort normal and breath sounds normal. No respiratory distress.  Abdominal: Soft, no distension, no ascites and no mass. There is no hepatosplenomegaly.  Musculoskeletal: Normal range of motion. He exhibits no edema or tenderness.  Neurological: He is alert and oriented to person, place, and time. He has normal strength. No cranial nerve deficit or sensory deficit.  Skin: Skin is warm, dry and intact. No petechiae and no rash noted.  Psychiatric: He has a normal mood and affect. His behavior is normal.   Disposition: 01-Home or Self Care     Medication List    STOP taking these medications        amLODipine 10 MG tablet  Commonly known as:  NORVASC     ibuprofen 600 MG tablet  Commonly known as:  ADVIL,MOTRIN      TAKE these medications        aspirin 81 MG tablet  Take 81 mg by mouth daily.     atorvastatin 20 MG tablet  Commonly known as:  LIPITOR  Take 20 mg by mouth daily.     dorzolamide-timolol 22.3-6.8 MG/ML ophthalmic solution  Commonly known as:  COSOPT  Place 1 drop into both eyes 2 (two) times daily.     doxazosin 4 MG tablet  Commonly known as:  CARDURA  Take 4 mg by mouth at bedtime.     latanoprost 0.005 % ophthalmic solution  Commonly known as:  XALATAN  Place 1 drop into both eyes daily.     losartan  100 MG tablet  Commonly known as:  COZAAR  Take 100 mg by mouth daily.     meclizine 25 MG tablet  Commonly known as:  ANTIVERT  Take 1 tablet (25 mg total) by mouth 3 (three) times daily as needed for  dizziness.     metoprolol succinate 25 MG 24 hr tablet  Commonly known as:  TOPROL-XL  Take 25 mg by mouth daily.     nitroGLYCERIN 0.4 MG SL tablet  Commonly known as:  NITROSTAT  Place 0.4 mg under the tongue every 5 (five) minutes as needed for chest pain.     omeprazole 20 MG capsule  Commonly known as:  PRILOSEC  Take 20 mg by mouth daily as needed (heart burn).     traMADol 50 MG tablet  Commonly known as:  ULTRAM  Take 1 tablet (50 mg total) by mouth every 8 (eight) hours as needed for pain.           Follow-up Information    Follow up with Clent Demark, MD. Schedule an appointment as soon as possible for a visit in 2 weeks.   Specialty:  Cardiology   Contact information:   [place] [address] [place] [address] [phone]       Signed: Birdie Riddle 09/22/2014, 7:00 PM

## 2014-09-22 NOTE — Progress Notes (Signed)
UR completed 

## 2014-09-22 NOTE — Consult Note (Signed)
Referring Physician: Dr. Claudine Mouton    Chief Complaint: vertigo  HPI:                                                                                                                                         Logan Orozco is an 79 y.o. male with a past medical history significant for HTN, hypercholesterolemia, CAD, GERD, presents to the ED with complains of vertigo. He said that he had had vertigo before, last episode this past November, and overall today's vertigo is not different than previous episodes.' He said that yesterday around 2 pm he became nauseated, vomited, and had vertigo when standing up and has been present ever since. The vertigo is not related to head movements and there is not tinnitus or hearing impairment. Denies double vision, difficulty swallowing, unsteadiness, focal weakness or numbness, slurred speech, language or vision impairment. Said that meclizine has helped to control his vertigo. CT brain showed no acute abnormality.  Date last known well: 09/21/14 Time last known well: 2 pm tPA Given: no, out of the window.   Past Medical History  Diagnosis Date  . GERD (gastroesophageal reflux disease)   . Hypercholesteremia   . Hypertension   . Glaucoma   . Cataract   . Coronary artery disease   . PVC's (premature ventricular contractions)     Past Surgical History  Procedure Laterality Date  . Cataract extraction      x2    Family History  Problem Relation Age of Onset  . Cancer Sister     breast  . Heart disease Brother   . Diabetes Other    Social History:  reports that he has never smoked. He does not have any smokeless tobacco history on file. He reports that he does not drink alcohol or use illicit drugs.  Allergies:  Allergies  Allergen Reactions  . Tetracyclines & Related     Medications:                                                                                                                           I have reviewed the patient's current  medications.  ROS:  History obtained from the patient and chart review  General ROS: negative for - chills, fatigue, fever, night sweats, weight gain or weight loss Psychological ROS: negative for - behavioral disorder, hallucinations, memory difficulties, mood swings or suicidal ideation Ophthalmic ROS: negative for - blurry vision, double vision, eye pain or loss of vision ENT ROS: negative for - epistaxis, nasal discharge, oral lesions, sore throat, tinnitus or vertigo Allergy and Immunology ROS: negative for - hives or itchy/watery eyes Hematological and Lymphatic ROS: negative for - bleeding problems, bruising or swollen lymph nodes Endocrine ROS: negative for - galactorrhea, hair pattern changes, polydipsia/polyuria or temperature intolerance Respiratory ROS: negative for - cough, hemoptysis, shortness of breath or wheezing Cardiovascular ROS: negative for - chest pain, dyspnea on exertion, edema or irregular heartbeat Gastrointestinal ROS: negative for - abdominal pain, diarrhea, hematemesis, nausea/vomiting or stool incontinence Genito-Urinary ROS: negative for - dysuria, hematuria, incontinence or urinary frequency/urgency Musculoskeletal ROS: negative for - joint swelling or muscular weakness Neurological ROS: as noted in HPI Dermatological ROS: negative for rash and skin lesion changes  Physical exam: pleasant male in no apparent distress. Blood pressure 115/48, pulse 63, temperature 97.7 F (36.5 C), temperature source Oral, resp. rate 21, height $RemoveBe'5\' 10"'wEvSxJxbL$  (1.778 m), weight 68.04 kg (150 lb), SpO2 97 %. Head: normocephalic. Neck: supple, no bruits, no JVD. Cardiac: no murmurs. Lungs: clear. Abdomen: soft, no tender, no mass. Extremities: no edema. Neurologic Examination:                                                                                                       General: Mental Status: Alert, oriented, thought content appropriate.  Speech fluent without evidence of aphasia.  Able to follow 3 step commands without difficulty. Cranial Nerves: II: Discs flat bilaterally; Visual fields grossly normal, pupils equal, round, reactive to light and accommodation III,IV, VI: ptosis not present, extra-ocular motions intact bilaterally V,VII: smile symmetric, facial light touch sensation normal bilaterally VIII: hearing normal bilaterally IX,X: gag reflex present XI: bilateral shoulder shrug XII: midline tongue extension without atrophy or fasciculations  Motor: Right : Upper extremity   5/5    Left:     Upper extremity   5/5  Lower extremity   5/5     Lower extremity   5/5 Tone and bulk:normal tone throughout; no atrophy noted Sensory: Pinprick and light touch intact throughout, bilaterally Deep Tendon Reflexes:  Right: Upper Extremity   Left: Upper extremity   biceps (C-5 to C-6) 2/4   biceps (C-5 to C-6) 2/4 tricep (C7) 2/4    triceps (C7) 2/4 Brachioradialis (C6) 2/4  Brachioradialis (C6) 2/4  Lower Extremity Lower Extremity  quadriceps (L-2 to L-4) 2/4   quadriceps (L-2 to L-4) 2/4 Achilles (S1) 2/4   Achilles (S1) 2/4  Plantars: Right: downgoing   Left: downgoing Cerebellar: normal finger-to-nose,  normal heel-to-shin test Gait:  No tested for safety reasons. CV: pulses palpable throughout    Results for orders placed or performed during the hospital encounter of 09/22/14 (from the past 48 hour(s))  CBC with Differential  Status: None   Collection Time: 09/22/14  3:30 AM  Result Value Ref Range   WBC 7.2 4.0 - 10.5 K/uL   RBC 4.69 4.22 - 5.81 MIL/uL   Hemoglobin 13.1 13.0 - 17.0 g/dL   HCT 39.1 39.0 - 52.0 %   MCV 83.4 78.0 - 100.0 fL   MCH 27.9 26.0 - 34.0 pg   MCHC 33.5 30.0 - 36.0 g/dL   RDW 13.8 11.5 - 15.5 %   Platelets 228 150 - 400 K/uL   Neutrophils Relative % 62 43 - 77 %   Neutro Abs 4.4 1.7 -  7.7 K/uL   Lymphocytes Relative 25 12 - 46 %   Lymphs Abs 1.8 0.7 - 4.0 K/uL   Monocytes Relative 9 3 - 12 %   Monocytes Absolute 0.7 0.1 - 1.0 K/uL   Eosinophils Relative 4 0 - 5 %   Eosinophils Absolute 0.3 0.0 - 0.7 K/uL   Basophils Relative 0 0 - 1 %   Basophils Absolute 0.0 0.0 - 0.1 K/uL  Basic metabolic panel     Status: Abnormal   Collection Time: 09/22/14  3:30 AM  Result Value Ref Range   Sodium 142 137 - 147 mEq/L   Potassium 4.2 3.7 - 5.3 mEq/L   Chloride 105 96 - 112 mEq/L   CO2 25 19 - 32 mEq/L   Glucose, Bld 131 (H) 70 - 99 mg/dL   BUN 15 6 - 23 mg/dL   Creatinine, Ser 1.08 0.50 - 1.35 mg/dL   Calcium 9.1 8.4 - 10.5 mg/dL   GFR calc non Af Amer 64 (L) >90 mL/min   GFR calc Af Amer 74 (L) >90 mL/min    Comment: (NOTE) The eGFR has been calculated using the CKD EPI equation. This calculation has not been validated in all clinical situations. eGFR's persistently <90 mL/min signify possible Chronic Kidney Disease.    Anion gap 12 5 - 15  Urinalysis, Routine w reflex microscopic     Status: Abnormal   Collection Time: 09/22/14  3:31 AM  Result Value Ref Range   Color, Urine YELLOW YELLOW   APPearance CLOUDY (A) CLEAR   Specific Gravity, Urine 1.020 1.005 - 1.030   pH 7.0 5.0 - 8.0   Glucose, UA NEGATIVE NEGATIVE mg/dL   Hgb urine dipstick NEGATIVE NEGATIVE   Bilirubin Urine NEGATIVE NEGATIVE   Ketones, ur NEGATIVE NEGATIVE mg/dL   Protein, ur NEGATIVE NEGATIVE mg/dL   Urobilinogen, UA 1.0 0.0 - 1.0 mg/dL   Nitrite NEGATIVE NEGATIVE   Leukocytes, UA NEGATIVE NEGATIVE    Comment: MICROSCOPIC NOT DONE ON URINES WITH NEGATIVE PROTEIN, BLOOD, LEUKOCYTES, NITRITE, OR GLUCOSE <1000 mg/dL.   Dg Chest 2 View  09/22/2014   CLINICAL DATA:  Dizziness, acute onset.  Initial encounter.  EXAM: CHEST  2 VIEW  COMPARISON:  Chest radiograph from 07/11/2013  FINDINGS: The lungs are well-aerated and clear. There is no evidence of focal opacification, pleural effusion or  pneumothorax.  The heart is borderline normal in size; the mediastinal contour is within normal limits. No acute osseous abnormalities are seen.  IMPRESSION: No acute cardiopulmonary process seen.   Electronically Signed   By: Garald Balding M.D.   On: 09/22/2014 04:09   Ct Head Wo Contrast  09/22/2014   CLINICAL DATA:  Acute onset of dizziness.  Initial encounter.  EXAM: CT HEAD WITHOUT CONTRAST  TECHNIQUE: Contiguous axial images were obtained from the base of the skull through the vertex without intravenous contrast.  COMPARISON:  None.  FINDINGS: There is no evidence of acute infarction, mass lesion, or intra- or extra-axial hemorrhage on CT.  Mild subcortical white matter change likely reflects small vessel ischemic microangiopathy.  The posterior fossa, including the cerebellum, brainstem and fourth ventricle, is within normal limits. The third and lateral ventricles, and basal ganglia are unremarkable in appearance. The cerebral hemispheres are symmetric in appearance, with normal gray-white differentiation. No mass effect or midline shift is seen.  There is no evidence of fracture; visualized osseous structures are unremarkable in appearance. The visualized portions of the orbits are within normal limits. There is mild partial opacification of the right ethmoid air cells. The paranasal sinuses and mastoid air cells are well-aerated. No significant soft tissue abnormalities are seen.  IMPRESSION: 1. No acute intracranial pathology seen on CT. 2. Mild small vessel ischemic microangiopathy.   Electronically Signed   By: Garald Balding M.D.   On: 09/22/2014 04:14     Assessment: 79 y.o. male with isolated vertigo that is unrelated to head movements and without associated ear symptomatology. CT brain unremarkable. Likely peripheral vertigo as he had had vertigo before, but he has risk factors for stroke and isolated vertigo is often a manifestation of posterior circulation ischemia. Thus, will recommend  MRI brain. If MRI negative, patient should be able to go home today on meclizine which has been helpful before.    Dorian Pod, MD [facility] [phone]  09/22/2014, 5:07 AM

## 2014-09-22 NOTE — H&P (Signed)
Referring Physician:  [name] is an 79 y.o. male.                       Chief Complaint: Dizziness  HPI: 79 year old with past medical history of hypertension, hypercholesteremia, Glaucoma, coronary artery disease has dizziness increased with activity but not related with head movements or tinnitus or hearing impairment. Denies double vision, difficulty swallowing, unsteadiness, focal weakness or numbness, slurred speech, language or vision impairment. Ran out of meclizine that worked well in the past.   Past Medical History  Diagnosis Date  . GERD (gastroesophageal reflux disease)   . Hypercholesteremia   . Hypertension   . Glaucoma   . Cataract   . Coronary artery disease   . PVC's (premature ventricular contractions)       Past Surgical History  Procedure Laterality Date  . Cataract extraction      x2    Family History  Problem Relation Age of Onset  . Cancer Sister     breast  . Heart disease Brother   . Diabetes Other    Social History:  reports that he has never smoked. He does not have any smokeless tobacco history on file. He reports that he does not drink alcohol or use illicit drugs.  Allergies:  Allergies  Allergen Reactions  . Tetracyclines & Related     Medications Prior to Admission  Medication Sig Dispense Refill  . amLODipine (NORVASC) 10 MG tablet Take 10 mg by mouth daily.    Marland Kitchen aspirin 81 MG tablet Take 81 mg by mouth daily.    Marland Kitchen atorvastatin (LIPITOR) 20 MG tablet Take 20 mg by mouth daily.    . dorzolamide-timolol (COSOPT) 22.3-6.8 MG/ML ophthalmic solution Place 1 drop into both eyes 2 (two) times daily.    Marland Kitchen doxazosin (CARDURA) 4 MG tablet Take 4 mg by mouth at bedtime.    Marland Kitchen latanoprost (XALATAN) 0.005 % ophthalmic solution Place 1 drop into both eyes daily.    Marland Kitchen losartan (COZAAR) 100 MG tablet Take 100 mg by mouth daily.    . meclizine (ANTIVERT) 25 MG tablet Take 1 tablet (25 mg total) by mouth 4 (four) times daily. 12 tablet 0  .  metoprolol succinate (TOPROL-XL) 25 MG 24 hr tablet Take 25 mg by mouth daily.    . nitroGLYCERIN (NITROSTAT) 0.4 MG SL tablet Place 0.4 mg under the tongue every 5 (five) minutes as needed for chest pain.     Marland Kitchen omeprazole (PRILOSEC) 20 MG capsule Take 20 mg by mouth daily as needed (heart burn).     Marland Kitchen ibuprofen (ADVIL,MOTRIN) 600 MG tablet Take 1 tablet (600 mg total) by mouth 3 (three) times daily. (Patient not taking: Reported on 09/22/2014) 9 tablet 0  . traMADol (ULTRAM) 50 MG tablet Take 1 tablet (50 mg total) by mouth every 8 (eight) hours as needed for pain. (Patient not taking: Reported on 09/22/2014) 10 tablet 0    Results for orders placed or performed during the hospital encounter of 09/22/14 (from the past 48 hour(s))  CBC with Differential     Status: None   Collection Time: 09/22/14  3:30 AM  Result Value Ref Range   WBC 7.2 4.0 - 10.5 K/uL   RBC 4.69 4.22 - 5.81 MIL/uL   Hemoglobin 13.1 13.0 - 17.0 g/dL   HCT 39.1 39.0 - 52.0 %   MCV 83.4 78.0 - 100.0 fL   MCH 27.9 26.0 - 34.0 pg  MCHC 33.5 30.0 - 36.0 g/dL   RDW 13.8 11.5 - 15.5 %   Platelets 228 150 - 400 K/uL   Neutrophils Relative % 62 43 - 77 %   Neutro Abs 4.4 1.7 - 7.7 K/uL   Lymphocytes Relative 25 12 - 46 %   Lymphs Abs 1.8 0.7 - 4.0 K/uL   Monocytes Relative 9 3 - 12 %   Monocytes Absolute 0.7 0.1 - 1.0 K/uL   Eosinophils Relative 4 0 - 5 %   Eosinophils Absolute 0.3 0.0 - 0.7 K/uL   Basophils Relative 0 0 - 1 %   Basophils Absolute 0.0 0.0 - 0.1 K/uL  Basic metabolic panel     Status: Abnormal   Collection Time: 09/22/14  3:30 AM  Result Value Ref Range   Sodium 142 137 - 147 mEq/L   Potassium 4.2 3.7 - 5.3 mEq/L   Chloride 105 96 - 112 mEq/L   CO2 25 19 - 32 mEq/L   Glucose, Bld 131 (H) 70 - 99 mg/dL   BUN 15 6 - 23 mg/dL   Creatinine, Ser 1.08 0.50 - 1.35 mg/dL   Calcium 9.1 8.4 - 10.5 mg/dL   GFR calc non Af Amer 64 (L) >90 mL/min   GFR calc Af Amer 74 (L) >90 mL/min    Comment: (NOTE) The  eGFR has been calculated using the CKD EPI equation. This calculation has not been validated in all clinical situations. eGFR's persistently <90 mL/min signify possible Chronic Kidney Disease.    Anion gap 12 5 - 15  Urinalysis, Routine w reflex microscopic     Status: Abnormal   Collection Time: 09/22/14  3:31 AM  Result Value Ref Range   Color, Urine YELLOW YELLOW   APPearance CLOUDY (A) CLEAR   Specific Gravity, Urine 1.020 1.005 - 1.030   pH 7.0 5.0 - 8.0   Glucose, UA NEGATIVE NEGATIVE mg/dL   Hgb urine dipstick NEGATIVE NEGATIVE   Bilirubin Urine NEGATIVE NEGATIVE   Ketones, ur NEGATIVE NEGATIVE mg/dL   Protein, ur NEGATIVE NEGATIVE mg/dL   Urobilinogen, UA 1.0 0.0 - 1.0 mg/dL   Nitrite NEGATIVE NEGATIVE   Leukocytes, UA NEGATIVE NEGATIVE    Comment: MICROSCOPIC NOT DONE ON URINES WITH NEGATIVE PROTEIN, BLOOD, LEUKOCYTES, NITRITE, OR GLUCOSE <1000 mg/dL.   Dg Chest 2 View  09/22/2014   CLINICAL DATA:  Dizziness, acute onset.  Initial encounter.  EXAM: CHEST  2 VIEW  COMPARISON:  Chest radiograph from 07/11/2013  FINDINGS: The lungs are well-aerated and clear. There is no evidence of focal opacification, pleural effusion or pneumothorax.  The heart is borderline normal in size; the mediastinal contour is within normal limits. No acute osseous abnormalities are seen.  IMPRESSION: No acute cardiopulmonary process seen.   Electronically Signed   By: Garald Balding M.D.   On: 09/22/2014 04:09   Ct Head Wo Contrast  09/22/2014   CLINICAL DATA:  Acute onset of dizziness.  Initial encounter.  EXAM: CT HEAD WITHOUT CONTRAST  TECHNIQUE: Contiguous axial images were obtained from the base of the skull through the vertex without intravenous contrast.  COMPARISON:  None.  FINDINGS: There is no evidence of acute infarction, mass lesion, or intra- or extra-axial hemorrhage on CT.  Mild subcortical white matter change likely reflects small vessel ischemic microangiopathy.  The posterior fossa,  including the cerebellum, brainstem and fourth ventricle, is within normal limits. The third and lateral ventricles, and basal ganglia are unremarkable in appearance. The cerebral hemispheres are  symmetric in appearance, with normal gray-white differentiation. No mass effect or midline shift is seen.  There is no evidence of fracture; visualized osseous structures are unremarkable in appearance. The visualized portions of the orbits are within normal limits. There is mild partial opacification of the right ethmoid air cells. The paranasal sinuses and mastoid air cells are well-aerated. No significant soft tissue abnormalities are seen.  IMPRESSION: 1. No acute intracranial pathology seen on CT. 2. Mild small vessel ischemic microangiopathy.   Electronically Signed   By: Garald Balding M.D.   On: 09/22/2014 04:14    Review Of Systems General ROS: negative for - chills, fatigue, fever, night sweats, weight gain or weight loss Psychological ROS: negative for - behavioral disorder, hallucinations, memory difficulties, mood swings or suicidal ideation Ophthalmic ROS: negative for - blurry vision, double vision, eye pain or loss of vision ENT ROS: negative for - epistaxis, nasal discharge, oral lesions, sore throat, tinnitus or vertigo Allergy and Immunology ROS: negative for - hives or itchy/watery eyes Hematological and Lymphatic ROS: negative for - bleeding problems, bruising or swollen lymph nodes Endocrine ROS: negative for - galactorrhea, hair pattern changes, polydipsia/polyuria or temperature intolerance Respiratory ROS: negative for - cough, hemoptysis, shortness of breath or wheezing Cardiovascular ROS: negative for - chest pain, dyspnea on exertion, edema or irregular heartbeat Gastrointestinal ROS: negative for - abdominal pain, diarrhea, hematemesis, nausea/vomiting or stool incontinence Genito-Urinary ROS: negative for - dysuria, hematuria, incontinence or urinary  frequency/urgency Musculoskeletal ROS: negative for - joint swelling or muscular weakness Neurological ROS: as noted in HPI Dermatological ROS: negative for rash and skin lesion changes  Blood pressure 136/67, pulse 73, temperature 98 F (36.7 C), temperature source Oral, resp. rate 16, height _0  (1.778 m), weight 68 kg (149 lb 14.6 oz), SpO2 97 %. Physical Exam  Constitutional: Well-developed and well-nourished.No distress.  HENT: Normocephalic and atraumatic.  Nose: Nose normal.  Mouth/Throat: Oropharynx is clear and moist. No oropharyngeal exudate.  Eyes: Conjunctivae and EOM are normal. Pupils are equal, round, and reactive to light. No scleral icterus.  Neck: Normal range of motion. Neck supple. No tracheal deviation, no thyromegaly present.  Cardiovascular: Normal rate, regular rhythm, S1 normal, S2 normal, normal heart sounds, intact distal pulses and normal pulses. Exam reveals no gallop and no friction rub.No murmur heard. Pulses: Radial pulses are 2+ on the right side, and 2+ on the left side. Dorsalis pedis pulses are 2+ on the right side, and 2+ on the left side.  Pulmonary/Chest: Effort normal and breath sounds normal. No respiratory distress.   Abdominal: Soft. He exhibits no distension, no ascites and no mass. There is no hepatosplenomegaly.   Musculoskeletal: Normal range of motion. He exhibits no edema or tenderness.  Lymphadenopathy: He has no cervical adenopathy.  Neurological: He is alert and oriented to person, place, and time. He has normal strength. No cranial nerve deficit or sensory deficit.   Skin: Skin is warm, dry and intact. No petechiae and no rash noted.   Psychiatric: He has a normal mood and affect. His behavior is normal.   Nursing note and vitals reviewed  Assessment/Plan Vertigo Hypertension Glaucoma CAD Hypercholesteremia  Appreciate neurology consult. Awaiting MRI of head.  Birdie Riddle, MD  09/22/2014, 7:09 AM

## 2014-09-22 NOTE — ED Notes (Signed)
The pt has had dizziness with nausea since yesterday.  He has a history of the same no pain

## 2014-09-22 NOTE — ED Provider Notes (Signed)
CSN: [id number]     Arrival date & time 09/22/14  0239 History  This chart was scribed for Everlene Balls, MD by Marlowe Kays, ED Scribe. This patient was seen in room A11C/A11C and the patient's care was started at 3:13 AM.  Chief Complaint  Patient presents with  . Dizziness   Patient is a 79 y.o. male presenting with dizziness. The history is provided by the patient. No language interpreter was used.  Dizziness Associated symptoms: nausea   Associated symptoms: no vomiting     HPI Comments:  Logan Orozco is a 79 y.o. male with PMH of vertigo who presents to the Emergency Department complaining of dizziness that began approximately 13 hours ago. He reports associated nausea that started later on in the day. 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 He states the symptoms have been intermittent throughout the day. Pt reports that these symptoms occur about one per year. He took a Meclizine earlier today that he received from the urgent care but is now out. He states standing makes the dizziness worse and he reports that he feels off balance while walking. Lying still tends to help alleviate the symptoms. Denies numbness of the hands, feet or face, fever, chills, vomiting, abdominal pain. He currently denies any dizziness or nausea. PMH of GERD, hypercholesteremia, HTN, CAD and PVCs. Past surgical h/o cataract extraction and angioplasty.  Past Medical History  Diagnosis Date  . GERD (gastroesophageal reflux disease)   . Hypercholesteremia   . Hypertension   . Glaucoma   . Cataract   . Coronary artery disease   . PVC's (premature ventricular contractions)    Past Surgical History  Procedure Laterality Date  . Cataract extraction      x2   Family History  Problem Relation Age of Onset  . Cancer Sister     breast  . Heart disease Brother   . Diabetes Other    History  Substance Use Topics  . Smoking status: Never  Smoker   . Smokeless tobacco: Not on file  . Alcohol Use: No    Review of Systems  Constitutional: Negative for fever and chills.  Gastrointestinal: Positive for nausea. Negative for vomiting.  Neurological: Positive for dizziness. Negative for speech difficulty and numbness.  All other systems reviewed and are negative.   Allergies  Tetracyclines & related  Home Medications   Prior to Admission medications   Medication Sig Start Date End Date Taking? Authorizing Provider  amLODipine (NORVASC) 10 MG tablet Take 10 mg by mouth daily.   Yes Historical Provider, MD  aspirin 81 MG tablet Take 81 mg by mouth daily.   Yes Historical Provider, MD  atorvastatin (LIPITOR) 20 MG tablet Take 20 mg by mouth daily.   Yes Historical Provider, MD  dorzolamide-timolol (COSOPT) 22.3-6.8 MG/ML ophthalmic solution Place 1 drop into both eyes 2 (two) times daily.   Yes Historical Provider, MD  doxazosin (CARDURA) 4 MG tablet Take 4 mg by mouth at bedtime.   Yes Historical Provider, MD  latanoprost (XALATAN) 0.005 % ophthalmic solution Place 1 drop into both eyes daily.   Yes Historical Provider, MD  losartan (COZAAR) 100 MG tablet Take 100 mg by mouth daily.   Yes Historical Provider, MD  meclizine (ANTIVERT) 25 MG tablet Take 1 tablet (25 mg total) by mouth 4 (four) times daily. 08/08/14  Yes Freeman Caldron Baker, PA-C  metoprolol succinate (TOPROL-XL) 25 MG 24  hr tablet Take 25 mg by mouth daily.   Yes Historical Provider, MD  nitroGLYCERIN (NITROSTAT) 0.4 MG SL tablet Place 0.4 mg under the tongue every 5 (five) minutes as needed for chest pain.    Yes Historical Provider, MD  omeprazole (PRILOSEC) 20 MG capsule Take 20 mg by mouth daily as needed (heart burn).    Yes Historical Provider, MD  ibuprofen (ADVIL,MOTRIN) 600 MG tablet Take 1 tablet (600 mg total) by mouth 3 (three) times daily. Patient not taking: Reported on 09/22/2014 01/11/13   Carmin Muskrat, MD  traMADol (ULTRAM) 50 MG tablet Take 1 tablet  (50 mg total) by mouth every 8 (eight) hours as needed for pain. Patient not taking: Reported on 09/22/2014 01/11/13   Carmin Muskrat, MD   Triage Vitals: BP 103/90 mmHg  Pulse 76  Temp(Src) 97.7 F (36.5 C) (Oral)  Resp 23  Ht 5\' 10"  (1.778 m)  Wt 150 lb (68.04 kg)  BMI 21.52 kg/m2  SpO2 100% Physical Exam  Constitutional: He is oriented to person, place, and time. Vital signs are normal. He appears well-developed and well-nourished.  Non-toxic appearance. He does not appear ill. No distress.  HENT:  Head: Normocephalic and atraumatic.  Nose: Nose normal.  Mouth/Throat: Oropharynx is clear and moist. No oropharyngeal exudate.  Eyes: Conjunctivae and EOM are normal. Pupils are equal, round, and reactive to light. No scleral icterus.  Neck: Normal range of motion. Neck supple. No tracheal deviation, no edema, no erythema and normal range of motion present. No thyroid mass and no thyromegaly present.  Cardiovascular: Normal rate, regular rhythm, S1 normal, S2 normal, normal heart sounds, intact distal pulses and normal pulses.  Exam reveals no gallop and no friction rub.   No murmur heard. Pulses:      Radial pulses are 2+ on the right side, and 2+ on the left side.       Dorsalis pedis pulses are 2+ on the right side, and 2+ on the left side.  Pulmonary/Chest: Effort normal and breath sounds normal. No respiratory distress. He has no wheezes. He has no rhonchi. He has no rales.  Abdominal: Soft. Normal appearance and bowel sounds are normal. He exhibits no distension, no ascites and no mass. There is no hepatosplenomegaly. There is no tenderness. There is no rebound, no guarding and no CVA tenderness.  Musculoskeletal: Normal range of motion. He exhibits no edema or tenderness.  Lymphadenopathy:    He has no cervical adenopathy.  Neurological: He is alert and oriented to person, place, and time. He has normal strength. No cranial nerve deficit or sensory deficit. He exhibits normal  muscle tone. GCS eye subscore is 4. GCS verbal subscore is 5. GCS motor subscore is 6.  Normal strength and sensation 4 extremities. Normal cerebellar and gait testing.  Skin: Skin is warm, dry and intact. No petechiae and no rash noted. He is not diaphoretic. No erythema. No pallor.  Psychiatric: He has a normal mood and affect. His behavior is normal. Judgment normal.  Nursing note and vitals reviewed.   ED Course  Procedures (including critical care time) DIAGNOSTIC STUDIES: Oxygen Saturation is 100% on RA, normal by my interpretation.   COORDINATION OF CARE: 3:22 AM- Will order lab work and CT head. Pt verbalizes understanding and agrees to plan.  Medications - No data to display  Labs Review Labs Reviewed  BASIC METABOLIC PANEL - Abnormal; Notable for the following:    Glucose, Bld 131 (*)    GFR calc  non Af Amer 64 (*)    GFR calc Af Amer 74 (*)    All other components within normal limits  URINALYSIS, ROUTINE W REFLEX MICROSCOPIC - Abnormal; Notable for the following:    APPearance CLOUDY (*)    All other components within normal limits  CBC WITH DIFFERENTIAL    Imaging Review Dg Chest 2 View  09/22/2014   CLINICAL DATA:  Dizziness, acute onset.  Initial encounter.  EXAM: CHEST  2 VIEW  COMPARISON:  Chest radiograph from 07/11/2013  FINDINGS: The lungs are well-aerated and clear. There is no evidence of focal opacification, pleural effusion or pneumothorax.  The heart is borderline normal in size; the mediastinal contour is within normal limits. No acute osseous abnormalities are seen.  IMPRESSION: No acute cardiopulmonary process seen.   Electronically Signed   By: Garald Balding M.D.   On: 09/22/2014 04:09   Ct Head Wo Contrast  09/22/2014   CLINICAL DATA:  Acute onset of dizziness.  Initial encounter.  EXAM: CT HEAD WITHOUT CONTRAST  TECHNIQUE: Contiguous axial images were obtained from the base of the skull through the vertex without intravenous contrast.  COMPARISON:   None.  FINDINGS: There is no evidence of acute infarction, mass lesion, or intra- or extra-axial hemorrhage on CT.  Mild subcortical white matter change likely reflects small vessel ischemic microangiopathy.  The posterior fossa, including the cerebellum, brainstem and fourth ventricle, is within normal limits. The third and lateral ventricles, and basal ganglia are unremarkable in appearance. The cerebral hemispheres are symmetric in appearance, with normal gray-white differentiation. No mass effect or midline shift is seen.  There is no evidence of fracture; visualized osseous structures are unremarkable in appearance. The visualized portions of the orbits are within normal limits. There is mild partial opacification of the right ethmoid air cells. The paranasal sinuses and mastoid air cells are well-aerated. No significant soft tissue abnormalities are seen.  IMPRESSION: 1. No acute intracranial pathology seen on CT. 2. Mild small vessel ischemic microangiopathy.   Electronically Signed   By: Garald Balding M.D.   On: 09/22/2014 04:14     EKG Interpretation   Date/Time:  Monday September 22 2014 02:47:27 EST Ventricular Rate:  73 PR Interval:  208 QRS Duration: 86 QT Interval:  380 QTC Calculation: 419 R Axis:   7 Text Interpretation:  Sinus rhythm Borderline ST elevation, latera  prolonged pr Confirmed by Glynn Octave [phone]) on 09/22/2014  3:06:19 AM      MDM   Final diagnoses:  Dizziness    Patient presents to the ED for dizziness.  He has risk factors for stroke as well including HTN, HLD.  In addition to age will obtain neurology consult who advised for MRI to evaluate for stroke.  Patient admitted to Dr. Franki Cabot service, telemetry for continued management.    I personally performed the services described in this documentation, which was scribed in my presence. The recorded information has been reviewed and is accurate.    Everlene Balls, MD 09/22/14 [phone]

## 2015-10-15 DIAGNOSIS — D123 Benign neoplasm of transverse colon: Secondary | ICD-10-CM | POA: Diagnosis not present

## 2015-10-15 DIAGNOSIS — K5730 Diverticulosis of large intestine without perforation or abscess without bleeding: Secondary | ICD-10-CM | POA: Diagnosis not present

## 2015-10-15 DIAGNOSIS — Z1211 Encounter for screening for malignant neoplasm of colon: Secondary | ICD-10-CM | POA: Diagnosis not present

## 2015-10-15 DIAGNOSIS — D124 Benign neoplasm of descending colon: Secondary | ICD-10-CM | POA: Diagnosis not present

## 2015-10-15 DIAGNOSIS — K635 Polyp of colon: Secondary | ICD-10-CM | POA: Diagnosis not present

## 2015-11-17 DIAGNOSIS — I2510 Atherosclerotic heart disease of native coronary artery without angina pectoris: Secondary | ICD-10-CM | POA: Diagnosis not present

## 2015-11-17 DIAGNOSIS — N529 Male erectile dysfunction, unspecified: Secondary | ICD-10-CM | POA: Diagnosis not present

## 2015-11-17 DIAGNOSIS — I10 Essential (primary) hypertension: Secondary | ICD-10-CM | POA: Diagnosis not present

## 2015-11-17 DIAGNOSIS — E782 Mixed hyperlipidemia: Secondary | ICD-10-CM | POA: Diagnosis not present

## 2015-11-24 DIAGNOSIS — I10 Essential (primary) hypertension: Secondary | ICD-10-CM | POA: Diagnosis not present

## 2015-11-24 DIAGNOSIS — E785 Hyperlipidemia, unspecified: Secondary | ICD-10-CM | POA: Diagnosis not present

## 2015-11-24 DIAGNOSIS — R7309 Other abnormal glucose: Secondary | ICD-10-CM | POA: Diagnosis not present

## 2015-11-24 DIAGNOSIS — I25118 Atherosclerotic heart disease of native coronary artery with other forms of angina pectoris: Secondary | ICD-10-CM | POA: Diagnosis not present

## 2015-11-24 DIAGNOSIS — R0609 Other forms of dyspnea: Secondary | ICD-10-CM | POA: Diagnosis not present

## 2015-11-25 DIAGNOSIS — I2510 Atherosclerotic heart disease of native coronary artery without angina pectoris: Secondary | ICD-10-CM | POA: Diagnosis not present

## 2015-11-25 DIAGNOSIS — R7309 Other abnormal glucose: Secondary | ICD-10-CM | POA: Diagnosis not present

## 2015-11-25 DIAGNOSIS — E785 Hyperlipidemia, unspecified: Secondary | ICD-10-CM | POA: Diagnosis not present

## 2015-11-25 DIAGNOSIS — I10 Essential (primary) hypertension: Secondary | ICD-10-CM | POA: Diagnosis not present

## 2015-11-26 DIAGNOSIS — I10 Essential (primary) hypertension: Secondary | ICD-10-CM | POA: Diagnosis not present

## 2015-11-26 DIAGNOSIS — R7309 Other abnormal glucose: Secondary | ICD-10-CM | POA: Diagnosis not present

## 2015-11-26 DIAGNOSIS — E785 Hyperlipidemia, unspecified: Secondary | ICD-10-CM | POA: Diagnosis not present

## 2015-11-26 DIAGNOSIS — I25118 Atherosclerotic heart disease of native coronary artery with other forms of angina pectoris: Secondary | ICD-10-CM | POA: Diagnosis not present

## 2015-11-26 DIAGNOSIS — R0609 Other forms of dyspnea: Secondary | ICD-10-CM | POA: Diagnosis not present

## 2016-01-26 DIAGNOSIS — H2513 Age-related nuclear cataract, bilateral: Secondary | ICD-10-CM | POA: Diagnosis not present

## 2016-02-24 DIAGNOSIS — H401122 Primary open-angle glaucoma, left eye, moderate stage: Secondary | ICD-10-CM | POA: Diagnosis not present

## 2016-02-24 DIAGNOSIS — Z961 Presence of intraocular lens: Secondary | ICD-10-CM | POA: Diagnosis not present

## 2016-02-24 DIAGNOSIS — H401113 Primary open-angle glaucoma, right eye, severe stage: Secondary | ICD-10-CM | POA: Diagnosis not present

## 2016-03-10 DIAGNOSIS — H401122 Primary open-angle glaucoma, left eye, moderate stage: Secondary | ICD-10-CM | POA: Diagnosis not present

## 2016-03-10 DIAGNOSIS — Z961 Presence of intraocular lens: Secondary | ICD-10-CM | POA: Diagnosis not present

## 2016-03-10 DIAGNOSIS — H401113 Primary open-angle glaucoma, right eye, severe stage: Secondary | ICD-10-CM | POA: Diagnosis not present

## 2016-03-31 DIAGNOSIS — I25118 Atherosclerotic heart disease of native coronary artery with other forms of angina pectoris: Secondary | ICD-10-CM | POA: Diagnosis not present

## 2016-03-31 DIAGNOSIS — E785 Hyperlipidemia, unspecified: Secondary | ICD-10-CM | POA: Diagnosis not present

## 2016-03-31 DIAGNOSIS — I10 Essential (primary) hypertension: Secondary | ICD-10-CM | POA: Diagnosis not present

## 2016-03-31 DIAGNOSIS — R7309 Other abnormal glucose: Secondary | ICD-10-CM | POA: Diagnosis not present

## 2016-04-21 DIAGNOSIS — H401132 Primary open-angle glaucoma, bilateral, moderate stage: Secondary | ICD-10-CM | POA: Diagnosis not present

## 2016-05-11 DIAGNOSIS — H401132 Primary open-angle glaucoma, bilateral, moderate stage: Secondary | ICD-10-CM | POA: Diagnosis not present

## 2016-05-31 DIAGNOSIS — N3941 Urge incontinence: Secondary | ICD-10-CM | POA: Diagnosis not present

## 2016-05-31 DIAGNOSIS — N401 Enlarged prostate with lower urinary tract symptoms: Secondary | ICD-10-CM | POA: Diagnosis not present

## 2016-06-14 DIAGNOSIS — R42 Dizziness and giddiness: Secondary | ICD-10-CM | POA: Diagnosis not present

## 2016-06-14 DIAGNOSIS — I10 Essential (primary) hypertension: Secondary | ICD-10-CM | POA: Diagnosis not present

## 2016-06-14 DIAGNOSIS — I25118 Atherosclerotic heart disease of native coronary artery with other forms of angina pectoris: Secondary | ICD-10-CM | POA: Diagnosis not present

## 2016-06-14 DIAGNOSIS — R7309 Other abnormal glucose: Secondary | ICD-10-CM | POA: Diagnosis not present

## 2016-06-14 DIAGNOSIS — E785 Hyperlipidemia, unspecified: Secondary | ICD-10-CM | POA: Diagnosis not present

## 2016-08-05 DIAGNOSIS — H401134 Primary open-angle glaucoma, bilateral, indeterminate stage: Secondary | ICD-10-CM | POA: Diagnosis not present

## 2016-10-10 DIAGNOSIS — R69 Illness, unspecified: Secondary | ICD-10-CM | POA: Diagnosis not present

## 2016-11-24 ENCOUNTER — Other Ambulatory Visit: Payer: Self-pay | Admitting: Cardiology

## 2016-11-24 DIAGNOSIS — I10 Essential (primary) hypertension: Secondary | ICD-10-CM | POA: Diagnosis not present

## 2016-11-24 DIAGNOSIS — E785 Hyperlipidemia, unspecified: Secondary | ICD-10-CM | POA: Diagnosis not present

## 2016-11-24 DIAGNOSIS — I25110 Atherosclerotic heart disease of native coronary artery with unstable angina pectoris: Secondary | ICD-10-CM | POA: Diagnosis not present

## 2016-11-24 DIAGNOSIS — R079 Chest pain, unspecified: Secondary | ICD-10-CM

## 2016-11-24 DIAGNOSIS — R7309 Other abnormal glucose: Secondary | ICD-10-CM | POA: Diagnosis not present

## 2016-12-05 ENCOUNTER — Ambulatory Visit (HOSPITAL_COMMUNITY)
Admission: RE | Admit: 2016-12-05 | Discharge: 2016-12-05 | Disposition: A | Discharge status: Home / Self Care | Payer: Medicare HMO | Source: Ambulatory Visit | Attending: Cardiology | Admitting: Cardiology

## 2016-12-05 DIAGNOSIS — R079 Chest pain, unspecified: Secondary | ICD-10-CM | POA: Insufficient documentation

## 2016-12-05 DIAGNOSIS — E785 Hyperlipidemia, unspecified: Secondary | ICD-10-CM | POA: Diagnosis not present

## 2016-12-05 DIAGNOSIS — I10 Essential (primary) hypertension: Secondary | ICD-10-CM | POA: Diagnosis not present

## 2016-12-05 DIAGNOSIS — I25110 Atherosclerotic heart disease of native coronary artery with unstable angina pectoris: Secondary | ICD-10-CM | POA: Diagnosis not present

## 2016-12-05 DIAGNOSIS — R7309 Other abnormal glucose: Secondary | ICD-10-CM | POA: Diagnosis not present

## 2016-12-05 MED ORDER — REGADENOSON 0.4 MG/5ML IV SOLN
0.4000 mg | Freq: Once | INTRAVENOUS | Status: AC
  Administered 2016-12-05: 0.4 mg via INTRAVENOUS

## 2016-12-05 MED ORDER — REGADENOSON 0.4 MG/5ML IV SOLN
INTRAVENOUS | Status: AC
  Filled 2016-12-05: qty 5

## 2016-12-05 MED ORDER — TECHNETIUM TC 99M TETROFOSMIN IV KIT
10.0000 | PACK | Freq: Once | INTRAVENOUS | Status: AC | PRN
Start: 2016-12-05 — End: 2016-12-05
  Administered 2016-12-05: 10 via INTRAVENOUS

## 2016-12-05 MED ORDER — TECHNETIUM TC 99M TETROFOSMIN IV KIT
30.0000 | PACK | Freq: Once | INTRAVENOUS | Status: AC | PRN
Start: 2016-12-05 — End: 2016-12-05
  Administered 2016-12-05: 30 via INTRAVENOUS

## 2017-01-13 DIAGNOSIS — H401134 Primary open-angle glaucoma, bilateral, indeterminate stage: Secondary | ICD-10-CM | POA: Diagnosis not present

## 2017-04-07 DIAGNOSIS — E785 Hyperlipidemia, unspecified: Secondary | ICD-10-CM | POA: Diagnosis not present

## 2017-04-07 DIAGNOSIS — I2510 Atherosclerotic heart disease of native coronary artery without angina pectoris: Secondary | ICD-10-CM | POA: Diagnosis not present

## 2017-04-07 DIAGNOSIS — R0609 Other forms of dyspnea: Secondary | ICD-10-CM | POA: Diagnosis not present

## 2017-04-07 DIAGNOSIS — I351 Nonrheumatic aortic (valve) insufficiency: Secondary | ICD-10-CM | POA: Diagnosis not present

## 2017-04-07 DIAGNOSIS — I10 Essential (primary) hypertension: Secondary | ICD-10-CM | POA: Diagnosis not present

## 2017-04-07 DIAGNOSIS — R7309 Other abnormal glucose: Secondary | ICD-10-CM | POA: Diagnosis not present

## 2017-04-14 DIAGNOSIS — H401132 Primary open-angle glaucoma, bilateral, moderate stage: Secondary | ICD-10-CM | POA: Diagnosis not present

## 2017-04-17 DIAGNOSIS — I10 Essential (primary) hypertension: Secondary | ICD-10-CM | POA: Diagnosis not present

## 2017-04-17 DIAGNOSIS — I2510 Atherosclerotic heart disease of native coronary artery without angina pectoris: Secondary | ICD-10-CM | POA: Diagnosis not present

## 2017-04-17 DIAGNOSIS — E785 Hyperlipidemia, unspecified: Secondary | ICD-10-CM | POA: Diagnosis not present

## 2017-05-12 DIAGNOSIS — H401133 Primary open-angle glaucoma, bilateral, severe stage: Secondary | ICD-10-CM | POA: Diagnosis not present

## 2017-05-17 DIAGNOSIS — E785 Hyperlipidemia, unspecified: Secondary | ICD-10-CM | POA: Diagnosis not present

## 2017-05-17 DIAGNOSIS — R0609 Other forms of dyspnea: Secondary | ICD-10-CM | POA: Diagnosis not present

## 2017-05-17 DIAGNOSIS — I351 Nonrheumatic aortic (valve) insufficiency: Secondary | ICD-10-CM | POA: Diagnosis not present

## 2017-05-17 DIAGNOSIS — I10 Essential (primary) hypertension: Secondary | ICD-10-CM | POA: Diagnosis not present

## 2017-05-17 DIAGNOSIS — I2510 Atherosclerotic heart disease of native coronary artery without angina pectoris: Secondary | ICD-10-CM | POA: Diagnosis not present

## 2017-05-17 DIAGNOSIS — R7309 Other abnormal glucose: Secondary | ICD-10-CM | POA: Diagnosis not present

## 2017-06-09 DIAGNOSIS — H401133 Primary open-angle glaucoma, bilateral, severe stage: Secondary | ICD-10-CM | POA: Diagnosis not present

## 2017-06-15 DIAGNOSIS — Z125 Encounter for screening for malignant neoplasm of prostate: Secondary | ICD-10-CM | POA: Diagnosis not present

## 2017-06-15 DIAGNOSIS — N5201 Erectile dysfunction due to arterial insufficiency: Secondary | ICD-10-CM | POA: Diagnosis not present

## 2017-06-15 DIAGNOSIS — R350 Frequency of micturition: Secondary | ICD-10-CM | POA: Diagnosis not present

## 2017-08-10 DIAGNOSIS — H401133 Primary open-angle glaucoma, bilateral, severe stage: Secondary | ICD-10-CM | POA: Diagnosis not present

## 2017-08-30 DIAGNOSIS — Z23 Encounter for immunization: Secondary | ICD-10-CM | POA: Diagnosis not present

## 2017-09-18 DIAGNOSIS — E785 Hyperlipidemia, unspecified: Secondary | ICD-10-CM | POA: Diagnosis not present

## 2017-09-18 DIAGNOSIS — R7309 Other abnormal glucose: Secondary | ICD-10-CM | POA: Diagnosis not present

## 2017-09-18 DIAGNOSIS — I2510 Atherosclerotic heart disease of native coronary artery without angina pectoris: Secondary | ICD-10-CM | POA: Diagnosis not present

## 2017-09-18 DIAGNOSIS — I351 Nonrheumatic aortic (valve) insufficiency: Secondary | ICD-10-CM | POA: Diagnosis not present

## 2017-09-18 DIAGNOSIS — I10 Essential (primary) hypertension: Secondary | ICD-10-CM | POA: Diagnosis not present

## 2017-11-29 DIAGNOSIS — E119 Type 2 diabetes mellitus without complications: Secondary | ICD-10-CM | POA: Diagnosis not present

## 2017-11-29 DIAGNOSIS — Z961 Presence of intraocular lens: Secondary | ICD-10-CM | POA: Diagnosis not present

## 2017-11-29 DIAGNOSIS — H401133 Primary open-angle glaucoma, bilateral, severe stage: Secondary | ICD-10-CM | POA: Diagnosis not present

## 2018-01-25 DIAGNOSIS — E785 Hyperlipidemia, unspecified: Secondary | ICD-10-CM | POA: Diagnosis not present

## 2018-01-25 DIAGNOSIS — I2510 Atherosclerotic heart disease of native coronary artery without angina pectoris: Secondary | ICD-10-CM | POA: Diagnosis not present

## 2018-01-25 DIAGNOSIS — R7309 Other abnormal glucose: Secondary | ICD-10-CM | POA: Diagnosis not present

## 2018-01-25 DIAGNOSIS — I351 Nonrheumatic aortic (valve) insufficiency: Secondary | ICD-10-CM | POA: Diagnosis not present

## 2018-01-25 DIAGNOSIS — I10 Essential (primary) hypertension: Secondary | ICD-10-CM | POA: Diagnosis not present

## 2018-02-13 DIAGNOSIS — E785 Hyperlipidemia, unspecified: Secondary | ICD-10-CM | POA: Diagnosis not present

## 2018-02-13 DIAGNOSIS — I10 Essential (primary) hypertension: Secondary | ICD-10-CM | POA: Diagnosis not present

## 2018-02-13 DIAGNOSIS — R7303 Prediabetes: Secondary | ICD-10-CM | POA: Diagnosis not present

## 2018-03-03 DIAGNOSIS — H52223 Regular astigmatism, bilateral: Secondary | ICD-10-CM | POA: Diagnosis not present

## 2018-04-16 DIAGNOSIS — H401133 Primary open-angle glaucoma, bilateral, severe stage: Secondary | ICD-10-CM | POA: Diagnosis not present

## 2018-04-16 DIAGNOSIS — Z961 Presence of intraocular lens: Secondary | ICD-10-CM | POA: Diagnosis not present

## 2018-05-24 DIAGNOSIS — I351 Nonrheumatic aortic (valve) insufficiency: Secondary | ICD-10-CM | POA: Diagnosis not present

## 2018-05-24 DIAGNOSIS — R7309 Other abnormal glucose: Secondary | ICD-10-CM | POA: Diagnosis not present

## 2018-05-24 DIAGNOSIS — I25119 Atherosclerotic heart disease of native coronary artery with unspecified angina pectoris: Secondary | ICD-10-CM | POA: Diagnosis not present

## 2018-05-24 DIAGNOSIS — E785 Hyperlipidemia, unspecified: Secondary | ICD-10-CM | POA: Diagnosis not present

## 2018-05-24 DIAGNOSIS — I10 Essential (primary) hypertension: Secondary | ICD-10-CM | POA: Diagnosis not present

## 2018-08-06 DIAGNOSIS — E785 Hyperlipidemia, unspecified: Secondary | ICD-10-CM | POA: Diagnosis not present

## 2018-08-06 DIAGNOSIS — I10 Essential (primary) hypertension: Secondary | ICD-10-CM | POA: Diagnosis not present

## 2018-08-06 DIAGNOSIS — R7303 Prediabetes: Secondary | ICD-10-CM | POA: Diagnosis not present

## 2018-08-06 DIAGNOSIS — I25118 Atherosclerotic heart disease of native coronary artery with other forms of angina pectoris: Secondary | ICD-10-CM | POA: Diagnosis not present

## 2018-08-06 DIAGNOSIS — I351 Nonrheumatic aortic (valve) insufficiency: Secondary | ICD-10-CM | POA: Diagnosis not present

## 2018-08-08 DIAGNOSIS — Z961 Presence of intraocular lens: Secondary | ICD-10-CM | POA: Diagnosis not present

## 2018-08-08 DIAGNOSIS — H401133 Primary open-angle glaucoma, bilateral, severe stage: Secondary | ICD-10-CM | POA: Diagnosis not present

## 2018-09-03 DIAGNOSIS — R69 Illness, unspecified: Secondary | ICD-10-CM | POA: Diagnosis not present

## 2018-11-13 DIAGNOSIS — E785 Hyperlipidemia, unspecified: Secondary | ICD-10-CM | POA: Diagnosis not present

## 2018-11-13 DIAGNOSIS — I25118 Atherosclerotic heart disease of native coronary artery with other forms of angina pectoris: Secondary | ICD-10-CM | POA: Diagnosis not present

## 2018-11-13 DIAGNOSIS — I351 Nonrheumatic aortic (valve) insufficiency: Secondary | ICD-10-CM | POA: Diagnosis not present

## 2018-11-13 DIAGNOSIS — I10 Essential (primary) hypertension: Secondary | ICD-10-CM | POA: Diagnosis not present

## 2018-11-13 DIAGNOSIS — R7303 Prediabetes: Secondary | ICD-10-CM | POA: Diagnosis not present

## 2018-11-19 DIAGNOSIS — R0981 Nasal congestion: Secondary | ICD-10-CM | POA: Diagnosis not present

## 2018-12-04 DIAGNOSIS — N5201 Erectile dysfunction due to arterial insufficiency: Secondary | ICD-10-CM | POA: Diagnosis not present

## 2018-12-04 DIAGNOSIS — N401 Enlarged prostate with lower urinary tract symptoms: Secondary | ICD-10-CM | POA: Diagnosis not present

## 2018-12-04 DIAGNOSIS — R350 Frequency of micturition: Secondary | ICD-10-CM | POA: Diagnosis not present

## 2019-02-26 DIAGNOSIS — I10 Essential (primary) hypertension: Secondary | ICD-10-CM | POA: Diagnosis not present

## 2019-02-26 DIAGNOSIS — I2510 Atherosclerotic heart disease of native coronary artery without angina pectoris: Secondary | ICD-10-CM | POA: Diagnosis not present

## 2019-02-26 DIAGNOSIS — E119 Type 2 diabetes mellitus without complications: Secondary | ICD-10-CM | POA: Diagnosis not present

## 2019-02-26 DIAGNOSIS — E785 Hyperlipidemia, unspecified: Secondary | ICD-10-CM | POA: Diagnosis not present

## 2019-02-28 DIAGNOSIS — I351 Nonrheumatic aortic (valve) insufficiency: Secondary | ICD-10-CM | POA: Diagnosis not present

## 2019-02-28 DIAGNOSIS — I10 Essential (primary) hypertension: Secondary | ICD-10-CM | POA: Diagnosis not present

## 2019-02-28 DIAGNOSIS — E785 Hyperlipidemia, unspecified: Secondary | ICD-10-CM | POA: Diagnosis not present

## 2019-02-28 DIAGNOSIS — E119 Type 2 diabetes mellitus without complications: Secondary | ICD-10-CM | POA: Diagnosis not present

## 2019-02-28 DIAGNOSIS — I25118 Atherosclerotic heart disease of native coronary artery with other forms of angina pectoris: Secondary | ICD-10-CM | POA: Diagnosis not present

## 2019-03-05 DIAGNOSIS — R350 Frequency of micturition: Secondary | ICD-10-CM | POA: Diagnosis not present

## 2019-03-05 DIAGNOSIS — N401 Enlarged prostate with lower urinary tract symptoms: Secondary | ICD-10-CM | POA: Diagnosis not present

## 2019-03-07 DIAGNOSIS — I351 Nonrheumatic aortic (valve) insufficiency: Secondary | ICD-10-CM | POA: Diagnosis not present

## 2019-03-07 DIAGNOSIS — I10 Essential (primary) hypertension: Secondary | ICD-10-CM | POA: Diagnosis not present

## 2019-03-07 DIAGNOSIS — J309 Allergic rhinitis, unspecified: Secondary | ICD-10-CM | POA: Diagnosis not present

## 2019-03-07 DIAGNOSIS — I25118 Atherosclerotic heart disease of native coronary artery with other forms of angina pectoris: Secondary | ICD-10-CM | POA: Diagnosis not present

## 2019-03-07 DIAGNOSIS — E119 Type 2 diabetes mellitus without complications: Secondary | ICD-10-CM | POA: Diagnosis not present

## 2019-03-07 DIAGNOSIS — E785 Hyperlipidemia, unspecified: Secondary | ICD-10-CM | POA: Diagnosis not present

## 2019-03-11 DIAGNOSIS — Z961 Presence of intraocular lens: Secondary | ICD-10-CM | POA: Diagnosis not present

## 2019-03-11 DIAGNOSIS — H401133 Primary open-angle glaucoma, bilateral, severe stage: Secondary | ICD-10-CM | POA: Diagnosis not present

## 2019-03-12 DIAGNOSIS — E1169 Type 2 diabetes mellitus with other specified complication: Secondary | ICD-10-CM | POA: Diagnosis not present

## 2019-03-14 DIAGNOSIS — E119 Type 2 diabetes mellitus without complications: Secondary | ICD-10-CM | POA: Diagnosis not present

## 2019-04-01 DIAGNOSIS — E119 Type 2 diabetes mellitus without complications: Secondary | ICD-10-CM | POA: Diagnosis not present

## 2019-04-03 DIAGNOSIS — S161XXA Strain of muscle, fascia and tendon at neck level, initial encounter: Secondary | ICD-10-CM | POA: Diagnosis not present

## 2019-04-09 DIAGNOSIS — E782 Mixed hyperlipidemia: Secondary | ICD-10-CM | POA: Diagnosis not present

## 2019-04-09 DIAGNOSIS — E1169 Type 2 diabetes mellitus with other specified complication: Secondary | ICD-10-CM | POA: Diagnosis not present

## 2019-05-14 DIAGNOSIS — E119 Type 2 diabetes mellitus without complications: Secondary | ICD-10-CM | POA: Diagnosis not present

## 2019-05-23 DIAGNOSIS — E1169 Type 2 diabetes mellitus with other specified complication: Secondary | ICD-10-CM | POA: Diagnosis not present

## 2019-05-23 DIAGNOSIS — Z0000 Encounter for general adult medical examination without abnormal findings: Secondary | ICD-10-CM | POA: Diagnosis not present

## 2019-05-23 DIAGNOSIS — Z1389 Encounter for screening for other disorder: Secondary | ICD-10-CM | POA: Diagnosis not present

## 2019-05-23 DIAGNOSIS — E782 Mixed hyperlipidemia: Secondary | ICD-10-CM | POA: Diagnosis not present

## 2019-06-14 DIAGNOSIS — R69 Illness, unspecified: Secondary | ICD-10-CM | POA: Diagnosis not present

## 2019-06-27 DIAGNOSIS — E785 Hyperlipidemia, unspecified: Secondary | ICD-10-CM | POA: Diagnosis not present

## 2019-06-27 DIAGNOSIS — I10 Essential (primary) hypertension: Secondary | ICD-10-CM | POA: Diagnosis not present

## 2019-06-27 DIAGNOSIS — E119 Type 2 diabetes mellitus without complications: Secondary | ICD-10-CM | POA: Diagnosis not present

## 2019-06-27 DIAGNOSIS — I25119 Atherosclerotic heart disease of native coronary artery with unspecified angina pectoris: Secondary | ICD-10-CM | POA: Diagnosis not present

## 2019-08-19 DIAGNOSIS — Z961 Presence of intraocular lens: Secondary | ICD-10-CM | POA: Diagnosis not present

## 2019-08-19 DIAGNOSIS — H401133 Primary open-angle glaucoma, bilateral, severe stage: Secondary | ICD-10-CM | POA: Diagnosis not present

## 2019-10-31 DIAGNOSIS — E785 Hyperlipidemia, unspecified: Secondary | ICD-10-CM | POA: Diagnosis not present

## 2019-10-31 DIAGNOSIS — I351 Nonrheumatic aortic (valve) insufficiency: Secondary | ICD-10-CM | POA: Diagnosis not present

## 2019-10-31 DIAGNOSIS — J309 Allergic rhinitis, unspecified: Secondary | ICD-10-CM | POA: Diagnosis not present

## 2019-10-31 DIAGNOSIS — I25118 Atherosclerotic heart disease of native coronary artery with other forms of angina pectoris: Secondary | ICD-10-CM | POA: Diagnosis not present

## 2019-10-31 DIAGNOSIS — I10 Essential (primary) hypertension: Secondary | ICD-10-CM | POA: Diagnosis not present

## 2019-10-31 DIAGNOSIS — E119 Type 2 diabetes mellitus without complications: Secondary | ICD-10-CM | POA: Diagnosis not present

## 2020-01-28 DIAGNOSIS — E785 Hyperlipidemia, unspecified: Secondary | ICD-10-CM | POA: Diagnosis not present

## 2020-01-28 DIAGNOSIS — E119 Type 2 diabetes mellitus without complications: Secondary | ICD-10-CM | POA: Diagnosis not present

## 2020-01-28 DIAGNOSIS — I10 Essential (primary) hypertension: Secondary | ICD-10-CM | POA: Diagnosis not present

## 2020-01-30 DIAGNOSIS — E785 Hyperlipidemia, unspecified: Secondary | ICD-10-CM | POA: Diagnosis not present

## 2020-01-30 DIAGNOSIS — R0789 Other chest pain: Secondary | ICD-10-CM | POA: Diagnosis not present

## 2020-01-30 DIAGNOSIS — E119 Type 2 diabetes mellitus without complications: Secondary | ICD-10-CM | POA: Diagnosis not present

## 2020-01-30 DIAGNOSIS — I10 Essential (primary) hypertension: Secondary | ICD-10-CM | POA: Diagnosis not present

## 2020-01-30 DIAGNOSIS — J309 Allergic rhinitis, unspecified: Secondary | ICD-10-CM | POA: Diagnosis not present

## 2020-01-30 DIAGNOSIS — I351 Nonrheumatic aortic (valve) insufficiency: Secondary | ICD-10-CM | POA: Diagnosis not present

## 2020-01-30 DIAGNOSIS — I25118 Atherosclerotic heart disease of native coronary artery with other forms of angina pectoris: Secondary | ICD-10-CM | POA: Diagnosis not present

## 2020-02-10 DIAGNOSIS — H401133 Primary open-angle glaucoma, bilateral, severe stage: Secondary | ICD-10-CM | POA: Diagnosis not present

## 2020-02-10 DIAGNOSIS — Z961 Presence of intraocular lens: Secondary | ICD-10-CM | POA: Diagnosis not present

## 2020-03-06 DIAGNOSIS — N5201 Erectile dysfunction due to arterial insufficiency: Secondary | ICD-10-CM | POA: Diagnosis not present

## 2020-03-06 DIAGNOSIS — R351 Nocturia: Secondary | ICD-10-CM | POA: Diagnosis not present

## 2020-03-06 DIAGNOSIS — N3941 Urge incontinence: Secondary | ICD-10-CM | POA: Diagnosis not present

## 2020-03-06 DIAGNOSIS — N401 Enlarged prostate with lower urinary tract symptoms: Secondary | ICD-10-CM | POA: Diagnosis not present

## 2020-04-01 DIAGNOSIS — E1169 Type 2 diabetes mellitus with other specified complication: Secondary | ICD-10-CM | POA: Diagnosis not present

## 2020-04-01 DIAGNOSIS — I10 Essential (primary) hypertension: Secondary | ICD-10-CM | POA: Diagnosis not present

## 2020-04-01 DIAGNOSIS — E782 Mixed hyperlipidemia: Secondary | ICD-10-CM | POA: Diagnosis not present

## 2020-04-07 ENCOUNTER — Other Ambulatory Visit: Payer: Self-pay

## 2020-04-07 ENCOUNTER — Encounter (HOSPITAL_COMMUNITY): Payer: Self-pay

## 2020-04-07 ENCOUNTER — Ambulatory Visit (HOSPITAL_COMMUNITY)
Admission: EM | Admit: 2020-04-07 | Discharge: 2020-04-07 | Disposition: A | Discharge status: Home / Self Care | Payer: Medicare HMO | Attending: Family Medicine | Admitting: Family Medicine

## 2020-04-07 DIAGNOSIS — R739 Hyperglycemia, unspecified: Secondary | ICD-10-CM

## 2020-04-07 DIAGNOSIS — R42 Dizziness and giddiness: Secondary | ICD-10-CM | POA: Diagnosis not present

## 2020-04-07 LAB — CBG MONITORING, ED: Glucose-Capillary: 111 mg/dL — ABNORMAL HIGH (ref 70–99)

## 2020-04-07 NOTE — ED Triage Notes (Signed)
Pt presents because he believes his blood sugar level may be really high; pt states he could not find his glucometer to check it at home and does not check it normally like he should.  Pt states he takes his medication as prescribed but eats a lot of things that he shouldn't.

## 2020-04-07 NOTE — Discharge Instructions (Addendum)
Blood sugar 111 Please drink plenty of water Follow up with primary care and cardiology as planned or if symptoms persisting If worsening/changing follow up here or emergency room

## 2020-04-08 NOTE — ED Provider Notes (Addendum)
[facility]    CSN: [id number] Arrival date & time: 04/07/20  0818      History   Chief Complaint Chief Complaint  Patient presents with  . High Blood Sugar    HPI Logan Orozco is a 85 y.o. male history of hypertension, CAD, GERD, vertigo presenting today for evaluation of dizziness.  Patient reports that this morning he has felt lightheaded and a little "swimmy headed".  He has had some mild nausea, but denies vomiting.  He was concerned about his blood sugar being low prompting his arrival today as he does not have a glucometer to check at home.  He denies any chest pain or shortness of breath.  Denies headache or vision changes.  Denies difficulty speaking or weakness.  Symptoms are mainly prominent with changing position going from sitting to standing.  Denies spinning sensation.  Denies any new medicines or changes in medicines.  Denies change in oral intake/fluid intake.  HPI  Past Medical History:  Diagnosis Date  . Cataract   . Coronary artery disease   . GERD (gastroesophageal reflux disease)   . Glaucoma   . Hypercholesteremia   . Hypertension   . PVC's (premature ventricular contractions)   . Vertigo    HX OF VERTIGO    Patient Active Problem List   Diagnosis Date Noted  . Dizziness 09/22/2014  . Vertigo 09/22/2014    Past Surgical History:  Procedure Laterality Date  . CATARACT EXTRACTION     x2       Home Medications    Prior to Admission medications   Medication Sig Start Date End Date Taking? Authorizing Provider  aspirin 81 MG tablet Take 81 mg by mouth daily.    [provider]  atorvastatin (LIPITOR) 20 MG tablet Take 20 mg by mouth daily.    [provider]  dorzolamide-timolol (COSOPT) 22.3-6.8 MG/ML ophthalmic solution Place 1 drop into both eyes 2 (two) times daily.    [provider]  doxazosin (CARDURA) 4 MG tablet Take 4 mg by mouth at bedtime.    [provider]  latanoprost (XALATAN)  0.005 % ophthalmic solution Place 1 drop into both eyes daily.    [provider]  losartan (COZAAR) 100 MG tablet Take 100 mg by mouth daily.    [provider]  meclizine (ANTIVERT) 25 MG tablet Take 1 tablet (25 mg total) by mouth 3 (three) times daily as needed for dizziness. 09/22/14   Dixie Dials, MD  metoprolol succinate (TOPROL-XL) 25 MG 24 hr tablet Take 25 mg by mouth daily.    [provider]  nitroGLYCERIN (NITROSTAT) 0.4 MG SL tablet Place 0.4 mg under the tongue every 5 (five) minutes as needed for chest pain.     [provider]  omeprazole (PRILOSEC) 20 MG capsule Take 20 mg by mouth daily as needed (heart burn).     [provider]  traMADol (ULTRAM) 50 MG tablet Take 1 tablet (50 mg total) by mouth every 8 (eight) hours as needed for pain. Patient not taking: Reported on 09/22/2014 01/11/13   Carmin Muskrat, MD    Family History Family History  Problem Relation Age of Onset  . Cancer Sister        breast  . Heart disease Brother   . Diabetes Other     Social History Social History   Tobacco Use  . Smoking status: Never Smoker  . Smokeless tobacco: Never Used  Substance Use Topics  .  Alcohol use: No  . Drug use: No     Allergies   Tetracyclines & related   Review of Systems Review of Systems  Constitutional: Negative for fatigue and fever.  HENT: Negative for congestion, sinus pressure and sore throat.   Eyes: Negative for photophobia, pain and visual disturbance.  Respiratory: Negative for cough and shortness of breath.   Cardiovascular: Negative for chest pain.  Gastrointestinal: Positive for nausea. Negative for abdominal pain and vomiting.  Genitourinary: Negative for decreased urine volume and hematuria.  Musculoskeletal: Negative for myalgias, neck pain and neck stiffness.  Neurological: Positive for dizziness and light-headedness. Negative for syncope, facial asymmetry, speech difficulty, weakness,  numbness and headaches.     Physical Exam Triage Vital Signs ED Triage Vitals [04/07/20 0858]  Enc Vitals Group     BP 123/64     Pulse Rate 61     Resp 17     Temp 98 F (36.7 C)     Temp Source Oral     SpO2 99 %     Weight      Height      Head Circumference      Peak Flow      Pain Score 0     Pain Loc      Pain Edu?      Excl. in [place]?    No data found.  Updated Vital Signs BP 123/64 (BP Location: Left Arm)   Pulse 61   Temp 98 F (36.7 C) (Oral)   Resp 17   SpO2 99%   Visual Acuity Right Eye Distance:   Left Eye Distance:   Bilateral Distance:    Right Eye Near:   Left Eye Near:    Bilateral Near:     Physical Exam Vitals and nursing note reviewed.  Constitutional:      Appearance: He is well-developed.     Comments: No acute distress  HENT:     Head: Normocephalic and atraumatic.     Ears:     Comments: Bilateral ears without tenderness to palpation of external auricle, tragus and mastoid, EAC's without erythema or swelling, TM's with good bony landmarks and cone of light. Non erythematous.     Nose: Nose normal.     Mouth/Throat:     Comments: Oral mucosa pink and moist, no tonsillar enlargement or exudate. Posterior pharynx patent and nonerythematous, no uvula deviation or swelling. Normal phonation.  Eyes:     Extraocular Movements: Extraocular movements intact.     Conjunctiva/sclera: Conjunctivae normal.     Pupils: Pupils are equal, round, and reactive to light.  Cardiovascular:     Rate and Rhythm: Normal rate and regular rhythm.     Heart sounds: No murmur heard.   Pulmonary:     Effort: Pulmonary effort is normal. No respiratory distress.     Comments: Breathing comfortably at rest, CTABL, no wheezing, rales or other adventitious sounds auscultated  Abdominal:     General: There is no distension.  Musculoskeletal:        General: Normal range of motion.     Cervical back: Neck supple.  Skin:    General: Skin is warm and dry.    Neurological:     Mental Status: He is alert and oriented to person, place, and time.     Comments: Patient A&O x3, cranial nerves II-XII grossly intact, strength at shoulders, hips and knees 5/5, equal bilaterally, patellar reflex 2+ bilaterally.Negative Romberg. Gait without abnormality.  UC Treatments / Results  Labs (all labs ordered are listed, but only abnormal results are displayed) Labs Reviewed  CBG MONITORING, ED - Abnormal; Notable for the following components:      Result Value   Glucose-Capillary 111 (*)    All other components within normal limits    EKG   Radiology No results found.  Procedures Procedures (including critical care time)  Medications Ordered in UC Medications - No data to display  Initial Impression / Assessment and Plan / UC Course  I have reviewed the triage vital signs and the nursing notes.  Pertinent labs & imaging results that were available during my care of the patient were reviewed by me and considered in my medical decision making (see chart for details).     Blood sugar 111, EKG sinus bradycardia 51 bpm, prior heart rates around low 60s.  No acute signs of ischemia or infarction.  No abnormal beats.  Negative orthostatics.  Possible recurrence of vertigo versus mild dehydration.  Patient wished to defer any blood work today.  Discussed general precautions and recommended close monitoring over the next 24 to 48 hours.  Follow-up with PCP if symptoms persisting, emergency room if worsening or changing.  Discussed strict return precautions. Patient verbalized understanding and is agreeable with plan.  Final Clinical Impressions(s) / UC Diagnoses   Final diagnoses:  Lightheadedness     Discharge Instructions     Blood sugar 111 Please drink plenty of water Follow up with primary care and cardiology as planned or if symptoms persisting If worsening/changing follow up here or emergency room     ED Prescriptions    None      PDMP not reviewed this encounter.   Janith Lima, PA-C 04/08/20 1618    Janith Lima, PA-C 04/08/20 1619

## 2020-07-17 DIAGNOSIS — I25118 Atherosclerotic heart disease of native coronary artery with other forms of angina pectoris: Secondary | ICD-10-CM | POA: Diagnosis not present

## 2020-07-17 DIAGNOSIS — E785 Hyperlipidemia, unspecified: Secondary | ICD-10-CM | POA: Diagnosis not present

## 2020-07-17 DIAGNOSIS — I351 Nonrheumatic aortic (valve) insufficiency: Secondary | ICD-10-CM | POA: Diagnosis not present

## 2020-07-17 DIAGNOSIS — I10 Essential (primary) hypertension: Secondary | ICD-10-CM | POA: Diagnosis not present

## 2020-07-17 DIAGNOSIS — J309 Allergic rhinitis, unspecified: Secondary | ICD-10-CM | POA: Diagnosis not present

## 2020-07-27 DIAGNOSIS — R69 Illness, unspecified: Secondary | ICD-10-CM | POA: Diagnosis not present

## 2020-08-24 DIAGNOSIS — I10 Essential (primary) hypertension: Secondary | ICD-10-CM | POA: Diagnosis not present

## 2020-08-24 DIAGNOSIS — E782 Mixed hyperlipidemia: Secondary | ICD-10-CM | POA: Diagnosis not present

## 2020-08-24 DIAGNOSIS — E1169 Type 2 diabetes mellitus with other specified complication: Secondary | ICD-10-CM | POA: Diagnosis not present

## 2020-09-14 DIAGNOSIS — Z0000 Encounter for general adult medical examination without abnormal findings: Secondary | ICD-10-CM | POA: Diagnosis not present

## 2020-09-14 DIAGNOSIS — E1169 Type 2 diabetes mellitus with other specified complication: Secondary | ICD-10-CM | POA: Diagnosis not present

## 2020-09-14 DIAGNOSIS — Z1389 Encounter for screening for other disorder: Secondary | ICD-10-CM | POA: Diagnosis not present

## 2020-09-14 DIAGNOSIS — I10 Essential (primary) hypertension: Secondary | ICD-10-CM | POA: Diagnosis not present

## 2020-09-14 DIAGNOSIS — E7800 Pure hypercholesterolemia, unspecified: Secondary | ICD-10-CM | POA: Diagnosis not present

## 2020-10-05 DIAGNOSIS — H52223 Regular astigmatism, bilateral: Secondary | ICD-10-CM | POA: Diagnosis not present

## 2020-10-07 DIAGNOSIS — H401133 Primary open-angle glaucoma, bilateral, severe stage: Secondary | ICD-10-CM | POA: Diagnosis not present

## 2020-10-07 DIAGNOSIS — Z961 Presence of intraocular lens: Secondary | ICD-10-CM | POA: Diagnosis not present

## 2020-11-02 DIAGNOSIS — E782 Mixed hyperlipidemia: Secondary | ICD-10-CM | POA: Diagnosis not present

## 2020-11-02 DIAGNOSIS — E7800 Pure hypercholesterolemia, unspecified: Secondary | ICD-10-CM | POA: Diagnosis not present

## 2020-11-02 DIAGNOSIS — E1169 Type 2 diabetes mellitus with other specified complication: Secondary | ICD-10-CM | POA: Diagnosis not present

## 2020-11-02 DIAGNOSIS — I10 Essential (primary) hypertension: Secondary | ICD-10-CM | POA: Diagnosis not present

## 2020-12-04 DIAGNOSIS — J309 Allergic rhinitis, unspecified: Secondary | ICD-10-CM | POA: Diagnosis not present

## 2020-12-04 DIAGNOSIS — E785 Hyperlipidemia, unspecified: Secondary | ICD-10-CM | POA: Diagnosis not present

## 2020-12-04 DIAGNOSIS — I351 Nonrheumatic aortic (valve) insufficiency: Secondary | ICD-10-CM | POA: Diagnosis not present

## 2020-12-04 DIAGNOSIS — I10 Essential (primary) hypertension: Secondary | ICD-10-CM | POA: Diagnosis not present

## 2020-12-04 DIAGNOSIS — I25118 Atherosclerotic heart disease of native coronary artery with other forms of angina pectoris: Secondary | ICD-10-CM | POA: Diagnosis not present

## 2020-12-29 DIAGNOSIS — I10 Essential (primary) hypertension: Secondary | ICD-10-CM | POA: Diagnosis not present

## 2020-12-29 DIAGNOSIS — E782 Mixed hyperlipidemia: Secondary | ICD-10-CM | POA: Diagnosis not present

## 2020-12-29 DIAGNOSIS — E1169 Type 2 diabetes mellitus with other specified complication: Secondary | ICD-10-CM | POA: Diagnosis not present

## 2020-12-29 DIAGNOSIS — E7800 Pure hypercholesterolemia, unspecified: Secondary | ICD-10-CM | POA: Diagnosis not present

## 2020-12-30 DIAGNOSIS — I25118 Atherosclerotic heart disease of native coronary artery with other forms of angina pectoris: Secondary | ICD-10-CM | POA: Diagnosis not present

## 2020-12-30 DIAGNOSIS — J309 Allergic rhinitis, unspecified: Secondary | ICD-10-CM | POA: Diagnosis not present

## 2020-12-30 DIAGNOSIS — R0609 Other forms of dyspnea: Secondary | ICD-10-CM | POA: Diagnosis not present

## 2020-12-30 DIAGNOSIS — I10 Essential (primary) hypertension: Secondary | ICD-10-CM | POA: Diagnosis not present

## 2020-12-30 DIAGNOSIS — I351 Nonrheumatic aortic (valve) insufficiency: Secondary | ICD-10-CM | POA: Diagnosis not present

## 2020-12-30 DIAGNOSIS — E785 Hyperlipidemia, unspecified: Secondary | ICD-10-CM | POA: Diagnosis not present

## 2021-01-08 DIAGNOSIS — E785 Hyperlipidemia, unspecified: Secondary | ICD-10-CM | POA: Diagnosis not present

## 2021-01-08 DIAGNOSIS — I25118 Atherosclerotic heart disease of native coronary artery with other forms of angina pectoris: Secondary | ICD-10-CM | POA: Diagnosis not present

## 2021-01-08 DIAGNOSIS — R0609 Other forms of dyspnea: Secondary | ICD-10-CM | POA: Diagnosis not present

## 2021-01-08 DIAGNOSIS — I10 Essential (primary) hypertension: Secondary | ICD-10-CM | POA: Diagnosis not present

## 2021-01-08 DIAGNOSIS — I351 Nonrheumatic aortic (valve) insufficiency: Secondary | ICD-10-CM | POA: Diagnosis not present

## 2021-01-27 DIAGNOSIS — I351 Nonrheumatic aortic (valve) insufficiency: Secondary | ICD-10-CM | POA: Diagnosis not present

## 2021-01-27 DIAGNOSIS — E785 Hyperlipidemia, unspecified: Secondary | ICD-10-CM | POA: Diagnosis not present

## 2021-01-27 DIAGNOSIS — I25118 Atherosclerotic heart disease of native coronary artery with other forms of angina pectoris: Secondary | ICD-10-CM | POA: Diagnosis not present

## 2021-01-27 DIAGNOSIS — I10 Essential (primary) hypertension: Secondary | ICD-10-CM | POA: Diagnosis not present

## 2021-01-27 DIAGNOSIS — J309 Allergic rhinitis, unspecified: Secondary | ICD-10-CM | POA: Diagnosis not present

## 2021-02-08 DIAGNOSIS — E7800 Pure hypercholesterolemia, unspecified: Secondary | ICD-10-CM | POA: Diagnosis not present

## 2021-02-08 DIAGNOSIS — E1169 Type 2 diabetes mellitus with other specified complication: Secondary | ICD-10-CM | POA: Diagnosis not present

## 2021-02-08 DIAGNOSIS — I10 Essential (primary) hypertension: Secondary | ICD-10-CM | POA: Diagnosis not present

## 2021-02-08 DIAGNOSIS — E782 Mixed hyperlipidemia: Secondary | ICD-10-CM | POA: Diagnosis not present

## 2021-04-09 DIAGNOSIS — J309 Allergic rhinitis, unspecified: Secondary | ICD-10-CM | POA: Diagnosis not present

## 2021-04-09 DIAGNOSIS — E785 Hyperlipidemia, unspecified: Secondary | ICD-10-CM | POA: Diagnosis not present

## 2021-04-09 DIAGNOSIS — I10 Essential (primary) hypertension: Secondary | ICD-10-CM | POA: Diagnosis not present

## 2021-04-09 DIAGNOSIS — E119 Type 2 diabetes mellitus without complications: Secondary | ICD-10-CM | POA: Diagnosis not present

## 2021-04-09 DIAGNOSIS — I351 Nonrheumatic aortic (valve) insufficiency: Secondary | ICD-10-CM | POA: Diagnosis not present

## 2021-04-09 DIAGNOSIS — I25118 Atherosclerotic heart disease of native coronary artery with other forms of angina pectoris: Secondary | ICD-10-CM | POA: Diagnosis not present

## 2021-04-20 DIAGNOSIS — E7800 Pure hypercholesterolemia, unspecified: Secondary | ICD-10-CM | POA: Diagnosis not present

## 2021-04-20 DIAGNOSIS — Z7984 Long term (current) use of oral hypoglycemic drugs: Secondary | ICD-10-CM | POA: Diagnosis not present

## 2021-04-20 DIAGNOSIS — I10 Essential (primary) hypertension: Secondary | ICD-10-CM | POA: Diagnosis not present

## 2021-04-20 DIAGNOSIS — E1169 Type 2 diabetes mellitus with other specified complication: Secondary | ICD-10-CM | POA: Diagnosis not present

## 2021-05-18 DIAGNOSIS — N3943 Post-void dribbling: Secondary | ICD-10-CM | POA: Diagnosis not present

## 2021-05-18 DIAGNOSIS — N401 Enlarged prostate with lower urinary tract symptoms: Secondary | ICD-10-CM | POA: Diagnosis not present

## 2021-05-18 DIAGNOSIS — N5201 Erectile dysfunction due to arterial insufficiency: Secondary | ICD-10-CM | POA: Diagnosis not present

## 2021-05-18 DIAGNOSIS — R3915 Urgency of urination: Secondary | ICD-10-CM | POA: Diagnosis not present

## 2021-06-09 DIAGNOSIS — I10 Essential (primary) hypertension: Secondary | ICD-10-CM | POA: Diagnosis not present

## 2021-06-09 DIAGNOSIS — E1169 Type 2 diabetes mellitus with other specified complication: Secondary | ICD-10-CM | POA: Diagnosis not present

## 2021-06-09 DIAGNOSIS — E782 Mixed hyperlipidemia: Secondary | ICD-10-CM | POA: Diagnosis not present

## 2021-06-09 DIAGNOSIS — E7800 Pure hypercholesterolemia, unspecified: Secondary | ICD-10-CM | POA: Diagnosis not present

## 2021-07-30 DIAGNOSIS — I10 Essential (primary) hypertension: Secondary | ICD-10-CM | POA: Diagnosis not present

## 2021-07-30 DIAGNOSIS — E785 Hyperlipidemia, unspecified: Secondary | ICD-10-CM | POA: Diagnosis not present

## 2021-07-30 DIAGNOSIS — I25118 Atherosclerotic heart disease of native coronary artery with other forms of angina pectoris: Secondary | ICD-10-CM | POA: Diagnosis not present

## 2021-09-02 DIAGNOSIS — H401133 Primary open-angle glaucoma, bilateral, severe stage: Secondary | ICD-10-CM | POA: Diagnosis not present

## 2021-09-02 DIAGNOSIS — Z961 Presence of intraocular lens: Secondary | ICD-10-CM | POA: Diagnosis not present

## 2021-09-17 DIAGNOSIS — J309 Allergic rhinitis, unspecified: Secondary | ICD-10-CM | POA: Diagnosis not present

## 2021-09-21 DIAGNOSIS — E782 Mixed hyperlipidemia: Secondary | ICD-10-CM | POA: Diagnosis not present

## 2021-09-21 DIAGNOSIS — I10 Essential (primary) hypertension: Secondary | ICD-10-CM | POA: Diagnosis not present

## 2021-09-21 DIAGNOSIS — E7800 Pure hypercholesterolemia, unspecified: Secondary | ICD-10-CM | POA: Diagnosis not present

## 2021-09-21 DIAGNOSIS — E1169 Type 2 diabetes mellitus with other specified complication: Secondary | ICD-10-CM | POA: Diagnosis not present

## 2021-09-22 DIAGNOSIS — J069 Acute upper respiratory infection, unspecified: Secondary | ICD-10-CM | POA: Diagnosis not present

## 2021-10-05 DIAGNOSIS — W108XXA Fall (on) (from) other stairs and steps, initial encounter: Secondary | ICD-10-CM | POA: Diagnosis not present

## 2021-10-05 DIAGNOSIS — M25511 Pain in right shoulder: Secondary | ICD-10-CM | POA: Diagnosis not present

## 2021-10-05 DIAGNOSIS — M25519 Pain in unspecified shoulder: Secondary | ICD-10-CM | POA: Diagnosis not present

## 2021-12-03 DIAGNOSIS — E1169 Type 2 diabetes mellitus with other specified complication: Secondary | ICD-10-CM | POA: Diagnosis not present

## 2021-12-03 DIAGNOSIS — I10 Essential (primary) hypertension: Secondary | ICD-10-CM | POA: Diagnosis not present

## 2021-12-03 DIAGNOSIS — I351 Nonrheumatic aortic (valve) insufficiency: Secondary | ICD-10-CM | POA: Diagnosis not present

## 2021-12-03 DIAGNOSIS — I25118 Atherosclerotic heart disease of native coronary artery with other forms of angina pectoris: Secondary | ICD-10-CM | POA: Diagnosis not present

## 2021-12-09 DIAGNOSIS — M25511 Pain in right shoulder: Secondary | ICD-10-CM | POA: Diagnosis not present

## 2021-12-09 DIAGNOSIS — M19011 Primary osteoarthritis, right shoulder: Secondary | ICD-10-CM | POA: Diagnosis not present

## 2022-01-04 DIAGNOSIS — M25511 Pain in right shoulder: Secondary | ICD-10-CM | POA: Diagnosis not present

## 2022-01-05 DIAGNOSIS — H401133 Primary open-angle glaucoma, bilateral, severe stage: Secondary | ICD-10-CM | POA: Diagnosis not present

## 2022-01-05 DIAGNOSIS — Z961 Presence of intraocular lens: Secondary | ICD-10-CM | POA: Diagnosis not present

## 2022-03-14 DIAGNOSIS — I25118 Atherosclerotic heart disease of native coronary artery with other forms of angina pectoris: Secondary | ICD-10-CM | POA: Diagnosis not present

## 2022-03-14 DIAGNOSIS — E119 Type 2 diabetes mellitus without complications: Secondary | ICD-10-CM | POA: Diagnosis not present

## 2022-03-14 DIAGNOSIS — I10 Essential (primary) hypertension: Secondary | ICD-10-CM | POA: Diagnosis not present

## 2022-03-14 DIAGNOSIS — E785 Hyperlipidemia, unspecified: Secondary | ICD-10-CM | POA: Diagnosis not present

## 2022-06-15 DIAGNOSIS — H401133 Primary open-angle glaucoma, bilateral, severe stage: Secondary | ICD-10-CM | POA: Diagnosis not present

## 2022-07-07 DIAGNOSIS — N3941 Urge incontinence: Secondary | ICD-10-CM | POA: Diagnosis not present

## 2022-07-07 DIAGNOSIS — N3943 Post-void dribbling: Secondary | ICD-10-CM | POA: Diagnosis not present

## 2022-07-07 DIAGNOSIS — N401 Enlarged prostate with lower urinary tract symptoms: Secondary | ICD-10-CM | POA: Diagnosis not present

## 2022-07-26 DIAGNOSIS — H401133 Primary open-angle glaucoma, bilateral, severe stage: Secondary | ICD-10-CM | POA: Diagnosis not present

## 2022-08-11 DIAGNOSIS — N3943 Post-void dribbling: Secondary | ICD-10-CM | POA: Diagnosis not present

## 2022-08-11 DIAGNOSIS — N401 Enlarged prostate with lower urinary tract symptoms: Secondary | ICD-10-CM | POA: Diagnosis not present

## 2022-08-11 DIAGNOSIS — N3941 Urge incontinence: Secondary | ICD-10-CM | POA: Diagnosis not present

## 2022-09-15 DIAGNOSIS — I10 Essential (primary) hypertension: Secondary | ICD-10-CM | POA: Diagnosis not present

## 2022-09-15 DIAGNOSIS — E1169 Type 2 diabetes mellitus with other specified complication: Secondary | ICD-10-CM | POA: Diagnosis not present

## 2022-09-15 DIAGNOSIS — E7800 Pure hypercholesterolemia, unspecified: Secondary | ICD-10-CM | POA: Diagnosis not present

## 2022-09-15 DIAGNOSIS — Z5181 Encounter for therapeutic drug level monitoring: Secondary | ICD-10-CM | POA: Diagnosis not present

## 2022-09-30 DIAGNOSIS — I10 Essential (primary) hypertension: Secondary | ICD-10-CM | POA: Diagnosis not present

## 2022-09-30 DIAGNOSIS — Z7984 Long term (current) use of oral hypoglycemic drugs: Secondary | ICD-10-CM | POA: Diagnosis not present

## 2022-09-30 DIAGNOSIS — Z5986 Financial insecurity: Secondary | ICD-10-CM | POA: Diagnosis not present

## 2022-09-30 DIAGNOSIS — Z8612 Personal history of poliomyelitis: Secondary | ICD-10-CM | POA: Diagnosis not present

## 2022-09-30 DIAGNOSIS — I25119 Atherosclerotic heart disease of native coronary artery with unspecified angina pectoris: Secondary | ICD-10-CM | POA: Diagnosis not present

## 2022-09-30 DIAGNOSIS — Z008 Encounter for other general examination: Secondary | ICD-10-CM | POA: Diagnosis not present

## 2022-09-30 DIAGNOSIS — N529 Male erectile dysfunction, unspecified: Secondary | ICD-10-CM | POA: Diagnosis not present

## 2022-09-30 DIAGNOSIS — N400 Enlarged prostate without lower urinary tract symptoms: Secondary | ICD-10-CM | POA: Diagnosis not present

## 2022-09-30 DIAGNOSIS — E785 Hyperlipidemia, unspecified: Secondary | ICD-10-CM | POA: Diagnosis not present

## 2022-09-30 DIAGNOSIS — E119 Type 2 diabetes mellitus without complications: Secondary | ICD-10-CM | POA: Diagnosis not present

## 2022-09-30 DIAGNOSIS — H409 Unspecified glaucoma: Secondary | ICD-10-CM | POA: Diagnosis not present

## 2022-09-30 DIAGNOSIS — M1990 Unspecified osteoarthritis, unspecified site: Secondary | ICD-10-CM | POA: Diagnosis not present

## 2022-09-30 DIAGNOSIS — N3943 Post-void dribbling: Secondary | ICD-10-CM | POA: Diagnosis not present

## 2022-10-31 DIAGNOSIS — Z961 Presence of intraocular lens: Secondary | ICD-10-CM | POA: Diagnosis not present

## 2022-10-31 DIAGNOSIS — H401133 Primary open-angle glaucoma, bilateral, severe stage: Secondary | ICD-10-CM | POA: Diagnosis not present

## 2022-11-01 DIAGNOSIS — E119 Type 2 diabetes mellitus without complications: Secondary | ICD-10-CM | POA: Diagnosis not present

## 2022-11-01 DIAGNOSIS — I10 Essential (primary) hypertension: Secondary | ICD-10-CM | POA: Diagnosis not present

## 2022-11-01 DIAGNOSIS — I25118 Atherosclerotic heart disease of native coronary artery with other forms of angina pectoris: Secondary | ICD-10-CM | POA: Diagnosis not present

## 2022-11-01 DIAGNOSIS — E785 Hyperlipidemia, unspecified: Secondary | ICD-10-CM | POA: Diagnosis not present

## 2023-03-03 DIAGNOSIS — E119 Type 2 diabetes mellitus without complications: Secondary | ICD-10-CM | POA: Diagnosis not present

## 2023-03-03 DIAGNOSIS — I10 Essential (primary) hypertension: Secondary | ICD-10-CM | POA: Diagnosis not present

## 2023-03-03 DIAGNOSIS — I25118 Atherosclerotic heart disease of native coronary artery with other forms of angina pectoris: Secondary | ICD-10-CM | POA: Diagnosis not present

## 2023-03-03 DIAGNOSIS — I351 Nonrheumatic aortic (valve) insufficiency: Secondary | ICD-10-CM | POA: Diagnosis not present

## 2023-03-31 DIAGNOSIS — H26493 Other secondary cataract, bilateral: Secondary | ICD-10-CM | POA: Diagnosis not present

## 2023-03-31 DIAGNOSIS — H401133 Primary open-angle glaucoma, bilateral, severe stage: Secondary | ICD-10-CM | POA: Diagnosis not present

## 2023-03-31 DIAGNOSIS — Z961 Presence of intraocular lens: Secondary | ICD-10-CM | POA: Diagnosis not present

## 2023-04-25 DIAGNOSIS — H26491 Other secondary cataract, right eye: Secondary | ICD-10-CM | POA: Diagnosis not present

## 2023-04-25 DIAGNOSIS — Z961 Presence of intraocular lens: Secondary | ICD-10-CM | POA: Diagnosis not present

## 2023-04-26 DIAGNOSIS — R195 Other fecal abnormalities: Secondary | ICD-10-CM | POA: Diagnosis not present

## 2023-04-26 DIAGNOSIS — Z1331 Encounter for screening for depression: Secondary | ICD-10-CM | POA: Diagnosis not present

## 2023-04-26 DIAGNOSIS — Z5181 Encounter for therapeutic drug level monitoring: Secondary | ICD-10-CM | POA: Diagnosis not present

## 2023-04-26 DIAGNOSIS — Z0000 Encounter for general adult medical examination without abnormal findings: Secondary | ICD-10-CM | POA: Diagnosis not present

## 2023-04-26 DIAGNOSIS — E1169 Type 2 diabetes mellitus with other specified complication: Secondary | ICD-10-CM | POA: Diagnosis not present

## 2023-04-26 DIAGNOSIS — I10 Essential (primary) hypertension: Secondary | ICD-10-CM | POA: Diagnosis not present

## 2023-04-26 DIAGNOSIS — E7800 Pure hypercholesterolemia, unspecified: Secondary | ICD-10-CM | POA: Diagnosis not present

## 2023-04-26 DIAGNOSIS — E119 Type 2 diabetes mellitus without complications: Secondary | ICD-10-CM | POA: Diagnosis not present

## 2023-04-27 DIAGNOSIS — D649 Anemia, unspecified: Secondary | ICD-10-CM | POA: Diagnosis not present

## 2023-04-27 DIAGNOSIS — E7800 Pure hypercholesterolemia, unspecified: Secondary | ICD-10-CM | POA: Diagnosis not present

## 2023-04-27 DIAGNOSIS — Z5181 Encounter for therapeutic drug level monitoring: Secondary | ICD-10-CM | POA: Diagnosis not present

## 2023-04-27 DIAGNOSIS — R195 Other fecal abnormalities: Secondary | ICD-10-CM | POA: Diagnosis not present

## 2023-04-27 DIAGNOSIS — E1169 Type 2 diabetes mellitus with other specified complication: Secondary | ICD-10-CM | POA: Diagnosis not present

## 2023-04-27 DIAGNOSIS — I10 Essential (primary) hypertension: Secondary | ICD-10-CM | POA: Diagnosis not present

## 2023-05-09 DIAGNOSIS — H26492 Other secondary cataract, left eye: Secondary | ICD-10-CM | POA: Diagnosis not present

## 2023-06-02 DIAGNOSIS — D649 Anemia, unspecified: Secondary | ICD-10-CM | POA: Diagnosis not present

## 2023-06-02 DIAGNOSIS — E1169 Type 2 diabetes mellitus with other specified complication: Secondary | ICD-10-CM | POA: Diagnosis not present

## 2023-07-03 DIAGNOSIS — I25118 Atherosclerotic heart disease of native coronary artery with other forms of angina pectoris: Secondary | ICD-10-CM | POA: Diagnosis not present

## 2023-07-03 DIAGNOSIS — E782 Mixed hyperlipidemia: Secondary | ICD-10-CM | POA: Diagnosis not present

## 2023-07-03 DIAGNOSIS — I10 Essential (primary) hypertension: Secondary | ICD-10-CM | POA: Diagnosis not present

## 2023-07-03 DIAGNOSIS — I351 Nonrheumatic aortic (valve) insufficiency: Secondary | ICD-10-CM | POA: Diagnosis not present

## 2023-08-01 DIAGNOSIS — H401133 Primary open-angle glaucoma, bilateral, severe stage: Secondary | ICD-10-CM | POA: Diagnosis not present

## 2023-08-01 DIAGNOSIS — Z961 Presence of intraocular lens: Secondary | ICD-10-CM | POA: Diagnosis not present

## 2023-11-10 DIAGNOSIS — I25118 Atherosclerotic heart disease of native coronary artery with other forms of angina pectoris: Secondary | ICD-10-CM | POA: Diagnosis not present

## 2023-11-10 DIAGNOSIS — I10 Essential (primary) hypertension: Secondary | ICD-10-CM | POA: Diagnosis not present

## 2023-11-10 DIAGNOSIS — E782 Mixed hyperlipidemia: Secondary | ICD-10-CM | POA: Diagnosis not present

## 2023-11-10 DIAGNOSIS — I351 Nonrheumatic aortic (valve) insufficiency: Secondary | ICD-10-CM | POA: Diagnosis not present

## 2023-12-05 DIAGNOSIS — H401133 Primary open-angle glaucoma, bilateral, severe stage: Secondary | ICD-10-CM | POA: Diagnosis not present

## 2023-12-05 DIAGNOSIS — Z961 Presence of intraocular lens: Secondary | ICD-10-CM | POA: Diagnosis not present

## 2024-02-14 DIAGNOSIS — Z961 Presence of intraocular lens: Secondary | ICD-10-CM | POA: Diagnosis not present

## 2024-02-14 DIAGNOSIS — H401133 Primary open-angle glaucoma, bilateral, severe stage: Secondary | ICD-10-CM | POA: Diagnosis not present

## 2024-03-07 DIAGNOSIS — Z961 Presence of intraocular lens: Secondary | ICD-10-CM | POA: Diagnosis not present

## 2024-03-07 DIAGNOSIS — H401133 Primary open-angle glaucoma, bilateral, severe stage: Secondary | ICD-10-CM | POA: Diagnosis not present

## 2024-03-07 DIAGNOSIS — I2510 Atherosclerotic heart disease of native coronary artery without angina pectoris: Secondary | ICD-10-CM | POA: Diagnosis not present

## 2024-03-07 DIAGNOSIS — F1099 Alcohol use, unspecified with unspecified alcohol-induced disorder: Secondary | ICD-10-CM | POA: Diagnosis not present

## 2024-03-07 DIAGNOSIS — E785 Hyperlipidemia, unspecified: Secondary | ICD-10-CM | POA: Diagnosis not present

## 2024-03-08 DIAGNOSIS — I25118 Atherosclerotic heart disease of native coronary artery with other forms of angina pectoris: Secondary | ICD-10-CM | POA: Diagnosis not present

## 2024-03-08 DIAGNOSIS — E119 Type 2 diabetes mellitus without complications: Secondary | ICD-10-CM | POA: Diagnosis not present

## 2024-03-08 DIAGNOSIS — I351 Nonrheumatic aortic (valve) insufficiency: Secondary | ICD-10-CM | POA: Diagnosis not present

## 2024-03-08 DIAGNOSIS — I10 Essential (primary) hypertension: Secondary | ICD-10-CM | POA: Diagnosis not present

## 2024-06-26 DIAGNOSIS — H401133 Primary open-angle glaucoma, bilateral, severe stage: Secondary | ICD-10-CM | POA: Diagnosis not present

## 2024-06-26 DIAGNOSIS — Z961 Presence of intraocular lens: Secondary | ICD-10-CM | POA: Diagnosis not present

## 2024-09-30 DIAGNOSIS — I10 Essential (primary) hypertension: Secondary | ICD-10-CM | POA: Diagnosis not present

## 2024-09-30 DIAGNOSIS — E119 Type 2 diabetes mellitus without complications: Secondary | ICD-10-CM | POA: Diagnosis not present

## 2024-09-30 DIAGNOSIS — I25118 Atherosclerotic heart disease of native coronary artery with other forms of angina pectoris: Secondary | ICD-10-CM | POA: Diagnosis not present

## 2024-09-30 DIAGNOSIS — E782 Mixed hyperlipidemia: Secondary | ICD-10-CM | POA: Diagnosis not present
